# Patient Record
Sex: Male | Born: 1985 | Race: Black or African American | Hispanic: No | Marital: Single | Smoking: Never smoker
Health system: Southern US, Community
[De-identification: ages and names within clinical notes are randomized; demographics above are authoritative.]

## PROBLEM LIST (undated history)

## (undated) ENCOUNTER — Ambulatory Visit: Discharge status: Absent without leave | Payer: Medicaid Other

## (undated) DIAGNOSIS — D689 Coagulation defect, unspecified: Secondary | ICD-10-CM

## (undated) DIAGNOSIS — F32A Depression, unspecified: Secondary | ICD-10-CM

## (undated) DIAGNOSIS — F329 Major depressive disorder, single episode, unspecified: Secondary | ICD-10-CM

## (undated) DIAGNOSIS — F200 Paranoid schizophrenia: Secondary | ICD-10-CM

## (undated) DIAGNOSIS — F319 Bipolar disorder, unspecified: Secondary | ICD-10-CM

## (undated) DIAGNOSIS — F419 Anxiety disorder, unspecified: Secondary | ICD-10-CM

## (undated) HISTORY — PX: FRACTURE SURGERY: SHX138

## (undated) HISTORY — DX: Coagulation defect, unspecified: D68.9

---
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
## 2008-08-11 ENCOUNTER — Emergency Department (HOSPITAL_COMMUNITY): Admission: EM | Admit: 2008-08-11 | Discharge: 2008-08-11 | Payer: Self-pay | Admitting: Emergency Medicine

## 2008-09-06 ENCOUNTER — Emergency Department (HOSPITAL_COMMUNITY): Admission: EM | Admit: 2008-09-06 | Discharge: 2008-09-06 | Payer: Self-pay | Admitting: Emergency Medicine

## 2009-01-13 ENCOUNTER — Emergency Department (HOSPITAL_COMMUNITY): Admission: EM | Admit: 2009-01-13 | Discharge: 2009-01-13 | Payer: Self-pay | Admitting: Emergency Medicine

## 2014-07-26 ENCOUNTER — Emergency Department (HOSPITAL_COMMUNITY)
Admission: EM | Admit: 2014-07-26 | Discharge: 2014-07-26 | Disposition: A | Discharge status: Home / Self Care | Payer: Medicaid Other | Attending: Emergency Medicine | Admitting: Emergency Medicine

## 2014-07-26 ENCOUNTER — Encounter (HOSPITAL_COMMUNITY): Payer: Self-pay | Admitting: Emergency Medicine

## 2014-07-26 DIAGNOSIS — R369 Urethral discharge, unspecified: Secondary | ICD-10-CM

## 2014-07-26 DIAGNOSIS — Z202 Contact with and (suspected) exposure to infections with a predominantly sexual mode of transmission: Secondary | ICD-10-CM | POA: Diagnosis not present

## 2014-07-26 DIAGNOSIS — F17200 Nicotine dependence, unspecified, uncomplicated: Secondary | ICD-10-CM | POA: Insufficient documentation

## 2014-07-26 DIAGNOSIS — A64 Unspecified sexually transmitted disease: Secondary | ICD-10-CM | POA: Insufficient documentation

## 2014-07-26 LAB — URINALYSIS, ROUTINE W REFLEX MICROSCOPIC
BILIRUBIN URINE: NEGATIVE
Glucose, UA: NEGATIVE mg/dL
Ketones, ur: NEGATIVE mg/dL
NITRITE: NEGATIVE
PH: 6 (ref 5.0–8.0)
Protein, ur: NEGATIVE mg/dL
SPECIFIC GRAVITY, URINE: 1.029 (ref 1.005–1.030)
UROBILINOGEN UA: 0.2 mg/dL (ref 0.0–1.0)

## 2014-07-26 LAB — URINE MICROSCOPIC-ADD ON

## 2014-07-26 LAB — HIV ANTIBODY (ROUTINE TESTING W REFLEX): HIV: NONREACTIVE

## 2014-07-26 MED ORDER — DOXYCYCLINE HYCLATE 100 MG PO CAPS: 100.0000 mg | ORAL_CAPSULE | Freq: Two times a day (BID) | ORAL | Status: DC

## 2014-07-26 MED ORDER — CEFTRIAXONE SODIUM 250 MG IJ SOLR
250.0000 mg | Freq: Once | INTRAMUSCULAR | Status: AC
  Administered 2014-07-26: 250 mg via INTRAMUSCULAR
  Filled 2014-07-26: qty 250

## 2014-07-26 MED ORDER — AZITHROMYCIN 250 MG PO TABS
1000.0000 mg | ORAL_TABLET | Freq: Once | ORAL | Status: AC
  Administered 2014-07-26: 1000 mg via ORAL
  Filled 2014-07-26: qty 4

## 2014-07-26 MED ORDER — LIDOCAINE HCL (PF) 1 % IJ SOLN
5.0000 mL | Freq: Once | INTRAMUSCULAR | Status: AC
  Administered 2014-07-26: 5 mL
  Filled 2014-07-26: qty 5

## 2014-07-26 NOTE — ED Notes (Signed)
Pt reports having penile discharge and burning with urination x 1 day.

## 2014-07-26 NOTE — ED Notes (Signed)
Declined W/C at D/C and was escorted to lobby by RN. 

## 2014-07-26 NOTE — Discharge Instructions (Signed)
Please call your doctor for a followup appointment within 24-48 hours. When you talk to your doctor please let them know that you were seen in the emergency department and have them acquire all of your records so that they can discuss the findings with you and formulate a treatment plan to fully care for your new and ongoing problems. Please call and set-up an appointment with your primary care provider Please call and set-up an appointment with Health department Please take antibiotics as prescribed Please have partner(s) be tested as well Please refrain from sexual activity Please continue to monitor symptoms closely and if symptoms are to worsen or change (fever greater than 101, chills, sweating, nausea, vomiting, chest pain, shortness of breathe, difficulty breathing, weakness, numbness, tingling, worsening or changes to pain pattern, abdominal pain, penile swelling, sores, rashes, swelling to the testicles, blood in the urine) please report back to the Emergency Department immediately.    Safe Sex Safe sex is about reducing the risk of giving or getting a sexually transmitted disease (STD). STDs are spread through sexual contact involving the genitals, mouth, or rectum. Some STDs can be cured and others cannot. Safe sex can also prevent unintended pregnancies.  WHAT ARE SOME SAFE SEX PRACTICES?  Limit your sexual activity to only one partner who is having sex with only you.  Talk to your partner about his or her past partners, past STDs, and drug use.  Use a condom every time you have sexual intercourse. This includes vaginal, oral, and anal sexual activity. Both females and males should wear condoms during oral sex. Only use latex or polyurethane condoms and water-based lubricants. Using petroleum-based lubricants or oils to lubricate a condom will weaken the condom and increase the chance that it will break. The condom should be in place from the beginning to the end of sexual activity.  Wearing a condom reduces, but does not completely eliminate, your risk of getting or giving an STD. STDs can be spread by contact with infected body fluids and skin.  Get vaccinated for hepatitis B and HPV.  Avoid alcohol and recreational drugs, which can affect your judgment. You may forget to use a condom or participate in high-risk sex.  For females, avoid douching after sexual intercourse. Douching can spread an infection farther into the reproductive tract.  Check your body for signs of sores, blisters, rashes, or unusual discharge. See your health care provider if you notice any of these signs.  Avoid sexual contact if you have symptoms of an infection or are being treated for an STD. If you or your partner has herpes, avoid sexual contact when blisters are present. Use condoms at all other times.  If you are at risk of being infected with HIV, it is recommended that you take a prescription medicine daily to prevent HIV infection. This is called pre-exposure prophylaxis (PrEP). You are considered at risk if:  You are a man who has sex with other men (MSM).  You are a heterosexual man or woman who is sexually active with more than one partner.  You take drugs by injection.  You are sexually active with a partner who has HIV.  Talk with your health care provider about whether you are at high risk of being infected with HIV. If you choose to begin PrEP, you should first be tested for HIV. You should then be tested every 3 months for as long as you are taking PrEP.  See your health care provider for regular screenings, exams,  and tests for other STDs. Before having sex with a new partner, each of you should be screened for STDs and should talk about the results with each other. WHAT ARE THE BENEFITS OF SAFE SEX?   There is less chance of getting or giving an STD.  You can prevent unwanted or unintended pregnancies.  By discussing safe sex concerns with your partner, you may increase  feelings of intimacy, comfort, trust, and honesty between the two of you. Document Released: 11/30/2004 Document Revised: 03/09/2014 Document Reviewed: 04/15/2012 [facility] Patient Information 2015 [place], [place]. This information is not intended to replace advice given to you by your health care provider. Make sure you discuss any questions you have with your health care provider. Sexually Transmitted Disease A sexually transmitted disease (STD) is a disease or infection that may be passed (transmitted) from person to person, usually during sexual activity. This may happen by way of saliva, semen, blood, vaginal mucus, or urine. Common STDs include:   Gonorrhea.   Chlamydia.   Syphilis.   HIV and AIDS.   Genital herpes.   Hepatitis B and C.   Trichomonas.   Human papillomavirus (HPV).   Pubic lice.   Scabies.  Mites.  Bacterial vaginosis. WHAT ARE CAUSES OF STDs? An STD may be caused by bacteria, a virus, or parasites. STDs are often transmitted during sexual activity if one person is infected. However, they may also be transmitted through nonsexual means. STDs may be transmitted after:   Sexual intercourse with an infected person.   Sharing sex toys with an infected person.   Sharing needles with an infected person or using unclean piercing or tattoo needles.  Having intimate contact with the genitals, mouth, or rectal areas of an infected person.   Exposure to infected fluids during birth. WHAT ARE THE SIGNS AND SYMPTOMS OF STDs? Different STDs have different symptoms. Some people may not have any symptoms. If symptoms are present, they may include:   Painful or bloody urination.   Pain in the pelvis, abdomen, vagina, anus, throat, or eyes.   A skin rash, itching, or irritation.  Growths, ulcerations, blisters, or sores in the genital and anal areas.  Abnormal vaginal discharge with or without bad odor.   Penile discharge in men.   Fever.    Pain or bleeding during sexual intercourse.   Swollen glands in the groin area.   Yellow skin and eyes (jaundice). This is seen with hepatitis.   Swollen testicles.  Infertility.  Sores and blisters in the mouth. HOW ARE STDs DIAGNOSED? To make a diagnosis, your health care provider may:   Take a medical history.   Perform a physical exam.   Take a sample of any discharge to examine.  Swab the throat, cervix, opening to the penis, rectum, or vagina for testing.  Test a sample of your first morning urine.   Perform blood tests.   Perform a Pap test, if this applies.   Perform a colposcopy.   Perform a laparoscopy.  HOW ARE STDs TREATED? Treatment depends on the STD. Some STDs may be treated but not cured.   Chlamydia, gonorrhea, trichomonas, and syphilis can be cured with antibiotic medicine.   Genital herpes, hepatitis, and HIV can be treated, but not cured, with prescribed medicines. The medicines lessen symptoms.   Genital warts from HPV can be treated with medicine or by freezing, burning (electrocautery), or surgery. Warts may come back.   HPV cannot be cured with medicine or surgery. However, abnormal  areas may be removed from the cervix, vagina, or vulva.   If your diagnosis is confirmed, your recent sexual partners need treatment. This is true even if they are symptom-free or have a negative culture or evaluation. They should not have sex until their health care providers say it is okay. HOW CAN I REDUCE MY RISK OF GETTING AN STD? Take these steps to reduce your risk of getting an STD:  Use latex condoms, dental dams, and water-soluble lubricants during sexual activity. Do not use petroleum jelly or oils.  Avoid having multiple sex partners.  Do not have sex with someone who has other sex partners.  Do not have sex with anyone you do not know or who is at high risk for an STD.  Avoid risky sex practices that can break your skin.  Do  not have sex if you have open sores on your mouth or skin.  Avoid drinking too much alcohol or taking illegal drugs. Alcohol and drugs can affect your judgment and put you in a vulnerable position.  Avoid engaging in oral and anal sex acts.  Get vaccinated for HPV and hepatitis. If you have not received these vaccines in the past, talk to your health care provider about whether one or both might be right for you.   If you are at risk of being infected with HIV, it is recommended that you take a prescription medicine daily to prevent HIV infection. This is called pre-exposure prophylaxis (PrEP). You are considered at risk if:  You are a man who has sex with other men (MSM).  You are a heterosexual man or woman and are sexually active with more than one partner.  You take drugs by injection.  You are sexually active with a partner who has HIV.  Talk with your health care provider about whether you are at high risk of being infected with HIV. If you choose to begin PrEP, you should first be tested for HIV. You should then be tested every 3 months for as long as you are taking PrEP.  WHAT SHOULD I DO IF I THINK I HAVE AN STD?  See your health care provider.   Tell your sexual partner(s). They should be tested and treated for any STDs.  Do not have sex until your health care provider says it is okay. WHEN SHOULD I GET IMMEDIATE MEDICAL CARE? Contact your health care provider right away if:   You have severe abdominal pain.  You are a man and notice swelling or pain in your testicles.  You are a woman and notice swelling or pain in your vagina. Document Released: 01/13/2003 Document Revised: 10/28/2013 Document Reviewed: 05/13/2013 [facility] Patient Information 2015 [place], [place]. This information is not intended to replace advice given to you by your health care provider. Make sure you discuss any questions you have with your health care provider.   Emergency Department Resource  Guide 1) Find a Doctor and Pay Out of Pocket Although you won't have to find out who is covered by your insurance plan, it is a good idea to ask around and get recommendations. You will then need to call the office and see if the doctor you have chosen will accept you as a new patient and what types of options they offer for patients who are self-pay. Some doctors offer discounts or will set up payment plans for their patients who do not have insurance, but you will need to ask so you aren't surprised when you get  to your appointment.  2) Contact Your Local Health Department Not all health departments have doctors that can see patients for sick visits, but many do, so it is worth a call to see if yours does. If you don't know where your local health department is, you can check in your phone book. The CDC also has a tool to help you locate your state's health department, and many state websites also have listings of all of their local health departments.  3) Find a Walk-in Clinic If your illness is not likely to be very severe or complicated, you may want to try a walk in clinic. These are popping up all over the country in pharmacies, drugstores, and shopping centers. They're usually staffed by nurse practitioners or physician assistants that have been trained to treat common illnesses and complaints. They're usually fairly quick and inexpensive. However, if you have serious medical issues or chronic medical problems, these are probably not your best option.  No Primary Care Doctor: - Call Health Connect at  [phone] - they can help you locate a primary care doctor that  accepts your insurance, provides certain services, etc. - Physician Referral Service- [phone]  Chronic Pain Problems: Organization         Address  Phone   Notes  [facility]  [phone] Patients need to be referred by their primary care doctor.   Medication Assistance: Organization          Address  Phone   Notes  [facility] Medication [facility] [address] [place] [place], [place] [address] [phone] --Must be a resident of [facility] -- Must have NO insurance coverage whatsoever (no Medicaid/ Medicare, etc.) -- The pt. MUST have a primary care doctor that directs their care regularly and follows them in the community   [facility]  [phone]   [employer]  [phone]    Agencies that provide inexpensive medical care: Organization         Address  Phone   Notes  [facility]  [phone]   [facility]    [phone]   [facility] [address] [place], [place] [address] [phone]   [facility] [address] [place] [phone]   Planned Parenthood    [phone]   [facility]    [phone]   [facility]  [address], [place] Phone:  [phone], Fax:  [phone] Hours of Operation:  9 am - 6 pm, M-F.  Also accepts Medicaid/Medicare and self-pay.  [facility]  [address], [place] Phone: [phone], Fax: [phone]. Hours of Operation:  8:30 am - 5:30 pm, M-F.  Also accepts Medicaid and self-pay.  [facility] [place] [address] [place] Phone: [phone]   [facility] [address] Natasha Bence [place], [place] [phone], Ext. [phone] Mondays & Thursdays: 7-9 AM.  First 15 patients are seen on a first come, first serve basis.    Medicaid-accepting [facility] [place] Providers:  Organization         Address  Phone   Notes  [facility] [address], [place] [phone] Also accepts self-pay patients.  [facility] [address] Laurell Josephs [place], [place]  [phone]   [facility] [phone], [place] [phone]   [facility] [address], [place] [phone]   Renaye Rakers [address], [place]   [phone] Only accepts [place] Access [place] patients after they have their name applied to their card.   Self-Pay (no insurance) in [facility]:  Organization         Address  Phone   Notes  Sickle Cell Patients, [facility] Internal Medicine [address] [place], [place] [phone]   [facility] [address] [place], [place] [phone]   [facility] [place]  [address], [place] [phone]   [facility]/Dr. Osei-Bonsu  [address], [place] or [address], [place] [phone] Phone number for both [place] and [place] locations is the same.  [facility] [address] [place] [phone]   [facility] [address] [place] or [address] [phone]   [facility] [address] [place] [phone], phone; [phone], fax Sees patients 1st and 3rd Saturday of every month.  Must not qualify for public or private insurance (i.e. Medicaid, Medicare, [place] Health Choice, Veterans' Benefits)  Household income should be no more than 200% of the poverty level The clinic cannot treat you if you are pregnant or think you are pregnant  Sexually transmitted diseases are not treated at the clinic.    Dental Care: Organization         Address  Phone  Notes  [facility] [address] [place], [place] [phone] Accepts children up to age 40 who are enrolled in [place] or [place] Health Choice; pregnant women with a Medicaid card; and children who have applied for Medicaid or [place] Health Choice, but were declined, whose parents can pay a reduced fee at time of service.  [facility]  [address], [place] [phone] Accepts children up to age 58 who are enrolled in [place] or [place] Health Choice; pregnant women with a Medicaid card; and children who have applied for Medicaid or [place] Health Choice, but were declined, whose parents can pay a reduced fee at time of service.  [facility]  [address] [place], [place] [phone] Patients are seen by appointment only. Walk-ins are not accepted. [facility] will see patients 47 years of age and older. Monday - Tuesday (8am-5pm) Most Wednesdays (8:30-5pm) $30 per visit, cash only  [facility] Adult Dental Access PROGRAM  [address], [facility] [phone] Patients are seen by appointment only. Walk-ins are not accepted. [facility] will see patients 53 years of age and older. One Wednesday Evening (Monthly: Volunteer Based).  $30 per visit, cash only  [employer]  [phone] for adults; Children under age 43, call Graduate Pediatric Dentistry at [phone]. Children aged 22-14, please call [phone] to request a pediatric application.  Dental services are provided in all areas of dental care including fillings, crowns and bridges, complete and partial dentures, implants, gum treatment, root canals, and extractions. Preventive care is also provided. Treatment is provided to both adults and children. Patients are selected via a lottery and there is often a waiting list.   [facility] [phone]  [address], [place]in[place] Otto  [phone] [email or web address]   [facility] [address] [place], [place] [phone], Ext. [phone] Second and Fourth Thursday of each month, opens at 6:30 AM; Clinic ends at 9 AM.  Patients are seen on a first-come first-served basis, and a limited number are seen during each clinic.   [facility]  [address] Ether Griffins [place], [place] [phone]   Eligibility Requirements You must  have lived in [place], [place], or [place] counties for at least the last three months.   You cannot be eligible for state or federal sponsored [place], including [employer], [place], or [employer].   You generally cannot be eligible for healthcare insurance through your employer.    How to apply: Eligibility screenings are held every Tuesday and Wednesday afternoon from 1:00 pm until 4:00 pm. You do not need an appointment for the interview!  [facility] [address] [place], [place] [phone]   [facility] Health Department  [phone]   [facility] Health Department  [phone]   [facility] Health Department  [phone]    Behavioral Health Resources in the Community: Intensive Outpatient Programs Organization         Address  Phone  Notes  [facility] [address], [place], [place] [phone]   [facility] Outpatient [address], [place], [place] [phone]   [facility] [address] [place], [place]  [phone]   [facility] [address]  [place], [place] [phone] or [phone]   Substance Abuse Resources Organization         Address  Phone  Notes  [facility]  [phone]   [facility]  [phone]   [facility]  [phone]   Floydene Flock  [phone]   Residential & Outpatient Substance Abuse Program  [phone]   Psychological Services Organization         Address  Phone  Notes  [facility] Behavioral Health  [phone]   [facility] Services  [phone]   [facility] Mental Health [address], [place] [phone] or [phone]    Mobile Crisis Teams Organization         Address  Phone  Notes  [facility], Mobile Crisis Care Unit  [phone]   [facility]  [address] [place] [address] [place], [place] [phone]   Doristine Locks [address] [place] [phone]    Self-Help/Support Groups Organization         Address  Phone             Notes  [facility] [place] - variety of support groups  336- I7437963 Call for more information  Narcotics Anonymous (NA), [facility] [address], [employer] [place]  2 meetings at this location   Statistician         Address  Phone  Notes  [facility] [address],    [place]  [phone]   [facility]  [address] [place] [address], [place], [place] [phone]   [facility] [address] [place], [place] [phone] Admissions: 8am-3pm M-F  [facility] [address],    [place], [place] [phone]   [facility] [address] [place], [place] [phone]   [facility] [address],  [place], [place]  [phone]   [facility] - Intensive Outpatient [address] Laurell Josephs [address], [place], [place] [phone]   [facility] ([facility] [address]  [place], [place] [phone] or [phone]   [facility] ([facility]) [address], [place], [place] [phone] Accepts Medicaid  [facility] [address]  [place] [phone] Substance Abuse/Addiction Treatment   [facility] Organization         Address  Phone  Notes  [facility]  [phone]   Angie Fava, PhD [address] Ervin Knack [place], [place]   [phone] or [phone]   [facility]   [address] [place], [place] [phone]   [facility] [address] [place], [place] [phone] Insurance/Medicaid/sponsorship through [facility] Families [address]                                    [place], [place] [phone] Therapy/tele-psych/case  [facility] [address], [place] [phone]    Dr. Lolly Mustache  [phone]   [facility]  [employer] [facility] Dept. 1) [address], [place] 2) [address] [place] 3)  [address], [place] [phone]  [phone]   [facility] Child Abuse Hotline (226) [phone] or [phone] (After Hours)

## 2014-07-26 NOTE — ED Provider Notes (Signed)
CSN: [id number]     Arrival date & time 07/26/14  0818 History   First MD Initiated Contact with Patient 07/26/14 [phone]     Chief Complaint  Patient presents with  . SEXUALLY TRANSMITTED DISEASE     (Consider location/radiation/quality/duration/timing/severity/associated sxs/prior Treatment) The history is provided by the patient. No language interpreter was used.  Jacob Rogers is a 28 y/o M with no known significant PMHx presenting to the ED with penile discharge and dysuria that has been ongoing since Friday night. Patient reported that the discharge is a clear, whitish color with no odor known. Patient reported that he has been having burning with urination. Stated that he is sexually active and normally uses protection, but stated that one encounter one week ago he did not. Denied itching, swelling to the penis, sores, lesions, hematuria, groin pain, abdominal pain, nausea, vomiting, diarrhea, fever, chills, rash, neck pain, neck stiffness, sore throat. PCP none  History reviewed. No pertinent past medical history. History reviewed. No pertinent past surgical history. History reviewed. No pertinent family history. History  Substance Use Topics  . Smoking status: Current Every Day Smoker    Types: Cigarettes  . Smokeless tobacco: Not on file  . Alcohol Use: No    Review of Systems  Constitutional: Negative for fever and chills.  Gastrointestinal: Negative for nausea, vomiting, abdominal pain and diarrhea.  Genitourinary: Positive for dysuria and discharge. Negative for frequency, hematuria, flank pain, penile swelling, scrotal swelling, genital sores, penile pain and testicular pain.  Musculoskeletal: Negative for back pain, neck pain and neck stiffness.      Allergies  Review of patient's allergies indicates no known allergies.  Home Medications   Prior to Admission medications   Medication Sig Start Date End Date Taking? Authorizing Provider  doxycycline (VIBRAMYCIN)  100 MG capsule Take 1 capsule (100 mg total) by mouth 2 (two) times daily. One po bid x 7 days 07/26/14   Shaivi Rothschild, PA-C   BP 133/79  Pulse 86  Temp(Src) 98.1 F (36.7 C) (Oral)  Resp 20  Ht  (1.803 m)  Wt 188 lb (85.276 kg)  BMI 26.23 kg/m2  SpO2 98% Physical Exam  Nursing note and vitals reviewed. Constitutional: He is oriented to person, place, and time. He appears well-developed and well-nourished. No distress.  HENT:  Head: Normocephalic and atraumatic.  Eyes: Conjunctivae and EOM are normal. Pupils are equal, round, and reactive to light. Right eye exhibits no discharge. Left eye exhibits no discharge.  Neck: Normal range of motion. Neck supple. No tracheal deviation present.  Negative neck stiffness Negative nuchal rigidity Negative cervical lymphadenopathy Negative meningeal signs  Cardiovascular: Normal rate, regular rhythm and normal heart sounds.  Exam reveals no friction rub.   No murmur heard. Pulses:      Radial pulses are 2+ on the right side, and 2+ on the left side.  Pulmonary/Chest: Effort normal and breath sounds normal. No respiratory distress. He has no wheezes. He has no rales.  Abdominal: Soft. Bowel sounds are normal. He exhibits no distension. There is no tenderness. There is no rebound and no guarding.  Negative abdominal distention Bowel sounds normal active in all 4 quadrants Abdomen soft upon palpation Negative pain upon palpation Negative peritoneal signs, guarding, rigidity  Genitourinary:  Pelvic exam: Negative swelling, erythema, formation, lesions, sores identified to the penis, scrotal region. Negative inguinal lymphadenopathy noted. Negative discharge noted from the tip of the penis. Unremarkable glans penis. Exam chaperoned with nurse.  Musculoskeletal: Normal  range of motion.  Lymphadenopathy:    He has no cervical adenopathy.  Neurological: He is alert and oriented to person, place, and time. No cranial nerve deficit. He  exhibits normal muscle tone. Coordination normal.  Skin: Skin is warm and dry. No rash noted. He is not diaphoretic. No erythema.  Psychiatric: He has a normal mood and affect. His behavior is normal. Thought content normal.    ED Course  Procedures (including critical care time)  Results for orders placed during the hospital encounter of 07/26/14  URINALYSIS, ROUTINE W REFLEX MICROSCOPIC      Result Value Ref Range   Color, Urine YELLOW  YELLOW   APPearance CLOUDY (*) CLEAR   Specific Gravity, Urine 1.029  1.005 - 1.030   pH 6.0  5.0 - 8.0   Glucose, UA NEGATIVE  NEGATIVE mg/dL   Hgb urine dipstick TRACE (*) NEGATIVE   Bilirubin Urine NEGATIVE  NEGATIVE   Ketones, ur NEGATIVE  NEGATIVE mg/dL   Protein, ur NEGATIVE  NEGATIVE mg/dL   Urobilinogen, UA 0.2  0.0 - 1.0 mg/dL   Nitrite NEGATIVE  NEGATIVE   Leukocytes, UA MODERATE (*) NEGATIVE  URINE MICROSCOPIC-ADD ON      Result Value Ref Range   WBC, UA TOO NUMEROUS TO COUNT  <3 WBC/hpf   RBC / HPF 3-6  <3 RBC/hpf   Bacteria, UA FEW (*) RARE    Labs Review Labs Reviewed  URINALYSIS, ROUTINE W REFLEX MICROSCOPIC - Abnormal; Notable for the following:    APPearance CLOUDY (*)    Hgb urine dipstick TRACE (*)    Leukocytes, UA MODERATE (*)    All other components within normal limits  URINE MICROSCOPIC-ADD ON - Abnormal; Notable for the following:    Bacteria, UA FEW (*)    All other components within normal limits  GC/CHLAMYDIA PROBE AMP  HIV ANTIBODY (ROUTINE TESTING)    Imaging Review No results found.   EKG Interpretation None      MDM   Final diagnoses:  Penile discharge  STD exposure    Medications  azithromycin (ZITHROMAX) tablet 1,000 mg (1,000 mg Oral Given 07/26/14 0948)  cefTRIAXone (ROCEPHIN) injection 250 mg (250 mg Intramuscular Given 07/26/14 0948)  lidocaine (PF) (XYLOCAINE) 1 % injection 5 mL (5 mLs Other Given 07/26/14 0948)    Filed Vitals:   07/26/14 0823  BP: 133/79  Pulse: 86  Temp: 98.1  F (36.7 C)  TempSrc: Oral  Resp: 20  Height:  (1.803 m)  Weight: 188 lb (85.276 kg)  SpO2: 98%   Urinalysis noted large amount of leukocytes with white blood cells too numerous to count. HIV, GC Chlamydia probe pending. Patient presenting with symptoms of STD - patient treated prophylactically in the ED setting. Patient stable, afebrile patient not septic appearing. Discharged patient. Discharged patient with antibiotics. Discussed with patient to rest and stay hydrated. Referred to health department. Discussed with patient importance of safe sex habits. Discussed with patient top refrain from sexual activity. Recommended partner(s) to be tested as well. Discussed with patient to closely monitor symptoms and if symptoms are to worsen or change to report back to the ED - strict return instructions given.  Patient agreed to plan of care, understood, all questions answered.   Raymon Mutton, PA-C 07/26/14 1022

## 2014-07-28 LAB — GC/CHLAMYDIA PROBE AMP
CT PROBE, AMP APTIMA: POSITIVE — AB
GC PROBE AMP APTIMA: POSITIVE — AB

## 2014-07-28 NOTE — ED Provider Notes (Signed)
Medical screening examination/treatment/procedure(s) were performed by non-physician practitioner and as supervising physician I was immediately available for consultation/collaboration.   Maico Mulvehill L Paislea Hatton, MD 07/28/14 2147 

## 2014-07-29 ENCOUNTER — Telehealth (HOSPITAL_COMMUNITY): Payer: Self-pay

## 2014-07-29 NOTE — ED Notes (Signed)
erified id. informed of positive test results for gonorrhea and chlamydia. treated per protocol. informed to notify partner and abstain from sexual activity x 10 Days. [employer] form completed and faxed.

## 2016-06-24 ENCOUNTER — Emergency Department (HOSPITAL_COMMUNITY)

## 2016-06-24 ENCOUNTER — Emergency Department (HOSPITAL_COMMUNITY)
Admission: EM | Admit: 2016-06-24 | Discharge: 2016-06-25 | Disposition: A | Discharge status: Home / Self Care | Attending: Emergency Medicine | Admitting: Emergency Medicine

## 2016-06-24 ENCOUNTER — Encounter (HOSPITAL_COMMUNITY): Payer: Self-pay | Admitting: *Deleted

## 2016-06-24 DIAGNOSIS — M25562 Pain in left knee: Secondary | ICD-10-CM | POA: Diagnosis not present

## 2016-06-24 DIAGNOSIS — W1839XA Other fall on same level, initial encounter: Secondary | ICD-10-CM | POA: Diagnosis not present

## 2016-06-24 DIAGNOSIS — Z87891 Personal history of nicotine dependence: Secondary | ICD-10-CM | POA: Diagnosis not present

## 2016-06-24 DIAGNOSIS — Y9367 Activity, basketball: Secondary | ICD-10-CM | POA: Insufficient documentation

## 2016-06-24 DIAGNOSIS — Y929 Unspecified place or not applicable: Secondary | ICD-10-CM | POA: Diagnosis not present

## 2016-06-24 DIAGNOSIS — Y999 Unspecified external cause status: Secondary | ICD-10-CM | POA: Diagnosis not present

## 2016-06-24 HISTORY — DX: Major depressive disorder, single episode, unspecified: F32.9

## 2016-06-24 HISTORY — DX: Paranoid schizophrenia: F20.0

## 2016-06-24 HISTORY — DX: Depression, unspecified: F32.A

## 2016-06-24 MED ORDER — HYDROCODONE-ACETAMINOPHEN 5-325 MG PO TABS: ORAL_TABLET | ORAL | 0 refills | Status: DC

## 2016-06-24 MED ORDER — IBUPROFEN 800 MG PO TABS
800.0000 mg | ORAL_TABLET | Freq: Once | ORAL | Status: AC
  Administered 2016-06-24: 800 mg via ORAL
  Filled 2016-06-24: qty 1

## 2016-06-24 MED ORDER — IBUPROFEN 800 MG PO TABS: 800.0000 mg | ORAL_TABLET | Freq: Three times a day (TID) | ORAL | 0 refills | Status: DC

## 2016-06-24 MED ORDER — OXYCODONE-ACETAMINOPHEN 5-325 MG PO TABS
1.0000 | ORAL_TABLET | Freq: Once | ORAL | Status: AC
  Administered 2016-06-24: 1 via ORAL
  Filled 2016-06-24: qty 1

## 2016-06-24 NOTE — ED Provider Notes (Signed)
[facility]-EMERGENCY DEPT Provider Note   CSN: [id number] Arrival date & time: 06/24/16  2019     History   Chief Complaint Chief Complaint  Patient presents with  . Knee Pain    HPI Jacob Rogers is a 30 y.o. male.  HPI   Jacob Rogers is a 30 y.o. male who is an inmate at a local correctional facility, who presents to the Emergency Department complaining of sudden onset of pain to the left knee approximately 2 hours prior to arrival.  He states that he was playing basketball when he stepped on someone's foot which caused him to "roll" his knee and fell.  He states he has been unable to bear weight since the accident.  He states he had a previous injury to the knee some time ago, that was never evaluated.  He denies numbness of the extremity, hip or back pain and pain to the calf.    Past Medical History:  Diagnosis Date  . Depression   . Paranoid schizophrenia (HCC)     There are no active problems to display for this patient.   Past Surgical History:  Procedure Laterality Date  . FRACTURE SURGERY     right foot       Home Medications    Prior to Admission medications   Medication Sig Start Date End Date Taking? Authorizing Provider  doxycycline (VIBRAMYCIN) 100 MG capsule Take 1 capsule (100 mg total) by mouth 2 (two) times daily. One po bid x 7 days 07/26/14   Raymon MuttonMarissa Sciacca, PA-C    Family History History reviewed. No pertinent family history.  Social History Social History  Substance Use Topics  . Smoking status: Former Smoker    Types: Cigarettes  . Smokeless tobacco: Never Used  . Alcohol use No     Allergies   Review of patient's allergies indicates no known allergies.   Review of Systems Review of Systems  Constitutional: Negative for chills and fever.  Genitourinary: Negative for difficulty urinating and dysuria.  Musculoskeletal: Positive for arthralgias (left knee pain). Negative for joint swelling.  Skin: Negative for color  change and wound.  Neurological: Negative for weakness and numbness.  All other systems reviewed and are negative.    Physical Exam Updated Vital Signs BP 125/64 (BP Location: Left Arm)   Pulse 77   Temp 98.8 F (37.1 C) (Oral)   Resp 18   Ht 5\' 11"  (1.803 m)   Wt 86.2 kg   SpO2 99%   BMI 26.50 kg/m   Physical Exam  Constitutional: He is oriented to person, place, and time. He appears well-developed and well-nourished. No distress.  HENT:  Head: Atraumatic.  Cardiovascular: Normal rate, regular rhythm and intact distal pulses.   Pulmonary/Chest: Effort normal and breath sounds normal.  Musculoskeletal: He exhibits tenderness. He exhibits no deformity.  ttp of the medial left knee.  No erythema, effusion, or step-off deformity.  Patella tendon appears intact. DP and PT pulses brisk, distal sensation intact. Calf is soft and NT.  Neurological: He is alert and oriented to person, place, and time. He exhibits normal muscle tone. Coordination normal.  Skin: Skin is warm and dry. No erythema.  Nursing note and vitals reviewed.    ED Treatments / Results  Labs (all labs ordered are listed, but only abnormal results are displayed) Labs Reviewed - No data to display  EKG  EKG Interpretation None       Radiology Ct Knee Left Wo Contrast  Result  Date: 06/24/2016 CLINICAL DATA:  Status post basketball injury, with apparent tiny posterior tibial plateau fracture fragment. Initial encounter. EXAM: CT OF THE RIGHT KNEE WITHOUT CONTRAST TECHNIQUE: Multidetector CT imaging of the right knee was performed according to the standard protocol. Multiplanar CT image reconstructions were also generated. COMPARISON:  None. FINDINGS: Bones/Joint/Cartilage Several small osseous fragments are seen along the posterior edge of the tibial spine and medial tibial plateau. The largest fragment measures 8 mm in size and may reflect injury about the posterior root of the medial meniscus. Remaining  fragments may reflect mild focal impaction injury. Ligaments Suboptimally assessed by CT. The anterior cruciate ligament appears grossly intact. The posterior cruciate ligament is difficult to fully assess. Mild soft tissue swelling is suggested about the medial collateral ligament and lateral collateral ligament complex. Muscles and Tendons The visualized musculature is grossly unremarkable in appearance. The quadriceps and patellar tendons appear grossly intact, aside from minimal edema tracking about the patellar tendon. Soft tissues Minimal edema is seen at Hoffa's fat pad. A small to moderate knee joint effusion is noted. The menisci are not well assessed on CT. IMPRESSION: 1. Several small osseous fragment seen about the posterior edge of the tibial spine and medial tibial plateau. The largest fragment measures 8 mm and may reflect injury about the posterior root of the medial meniscus. Remaining fragment may reflect mild focal impaction injury. 2. Posterior cruciate ligament not well assessed. Mild soft tissue swelling suggested about the medial collateral ligament and lateral collateral ligament complex. Minimal edema tracking about the patellar tendon, and at Hoffa's fat pad. MRI of the left knee would be helpful for further evaluation, to assess for internal derangement. 3. Small to moderate knee joint effusion noted. Electronically Signed   By: Roanna Raider M.D.   On: 06/24/2016 22:55   Dg Knee Complete 4 Views Left  Result Date: 06/24/2016 CLINICAL DATA:  30 year old male status post basketball injury tonight with pain. Initial encounter. EXAM: LEFT KNEE - COMPLETE 4+ VIEW COMPARISON:  None. FINDINGS: Small suprapatellar joint effusion is evident. Preserved joint spaces and alignment. Bone mineralization is within normal limits. Patella intact. There is a small 4-5 mm fracture fragment visible long the posterior tibial plateau on the lateral view. Otherwise no acute osseous abnormality. IMPRESSION:  Evidence of a small joint effusion and tiny fracture fragment visible long the posterior tibial plateau, raising the possibility of internal derangement with a small impaction fracture. Electronically Signed   By: Odessa Fleming M.D.   On: 06/24/2016 20:45    Procedures Procedures (including critical care time)  Medications Ordered in ED Medications  oxyCODONE-acetaminophen (PERCOCET/ROXICET) 5-325 MG per tablet 1 tablet (1 tablet Oral Given 06/24/16 2117)  ibuprofen (ADVIL,MOTRIN) tablet 800 mg (800 mg Oral Given 06/24/16 2117)     Initial Impression / Assessment and Plan / ED Course  I have reviewed the triage vital signs and the nursing notes.  Pertinent labs & imaging results that were available during my care of the patient were reviewed by me and considered in my medical decision making (see chart for details).  Clinical Course    2125  Consulted Dr. Linna Caprice who reviewed the images and recommended CT of the knee since MRI unavailable tonight, knee immobilizer, crutches and close orthopedic follow-up. Pt is inmate and their speciality services are contracted with another facility, so I have consulted Loraine Leriche, NP, at the correctional facilty and I was advised that services are contracted through [facility] orthopedics and he agrees to arrange the  follow-up.     I have discussed with the patient the likelihood of internal derangement of the knee and importance of follow-up, he agrees to plan and verbalized understanding.   He remains NV intact.  Has crutches from the facility, knee immobilizer applied here.  Advised to ice and no weight bearing.    Final Clinical Impressions(s) / ED Diagnoses   Final diagnoses:  Knee pain, acute, left    New Prescriptions New Prescriptions   No medications on file     Pauline Ausammy Lucifer Soja, Cordelia Poche-C 06/24/16 2344    Margarita Grizzleanielle Ray, MD 06/29/16 1252

## 2016-06-24 NOTE — ED Triage Notes (Signed)
Pt was playing basketball about an hour ago and he stepped on another persons foot and he injured his left knee. Pt states instead of "rolling his ankle," he says he "rolled his left knee."

## 2016-06-24 NOTE — Discharge Instructions (Signed)
Keep the knee immobilizer in place and use the crutches for weight bearing.  Apply ice packs on/off to your knee.  He will need to follow-up with orthopedics next week and this has been discussed with, Loraine LericheMark the NP, at the facility.

## 2016-06-25 NOTE — ED Notes (Signed)
[facility] phone number [phone]

## 2018-10-31 ENCOUNTER — Emergency Department (HOSPITAL_COMMUNITY): Admission: EM | Admit: 2018-10-31 | Discharge: 2018-10-31 | Discharge status: Against Medical Advice

## 2018-11-01 ENCOUNTER — Encounter (HOSPITAL_COMMUNITY): Payer: Self-pay | Admitting: Emergency Medicine

## 2018-11-01 ENCOUNTER — Emergency Department (HOSPITAL_COMMUNITY): Payer: Self-pay

## 2018-11-01 ENCOUNTER — Emergency Department (HOSPITAL_COMMUNITY)
Admission: EM | Admit: 2018-11-01 | Discharge: 2018-11-01 | Disposition: A | Discharge status: Home / Self Care | Payer: Self-pay | Attending: Emergency Medicine | Admitting: Emergency Medicine

## 2018-11-01 DIAGNOSIS — R0982 Postnasal drip: Secondary | ICD-10-CM | POA: Insufficient documentation

## 2018-11-01 DIAGNOSIS — F200 Paranoid schizophrenia: Secondary | ICD-10-CM | POA: Insufficient documentation

## 2018-11-01 DIAGNOSIS — R590 Localized enlarged lymph nodes: Secondary | ICD-10-CM | POA: Insufficient documentation

## 2018-11-01 DIAGNOSIS — Z87891 Personal history of nicotine dependence: Secondary | ICD-10-CM | POA: Insufficient documentation

## 2018-11-01 DIAGNOSIS — R059 Cough, unspecified: Secondary | ICD-10-CM

## 2018-11-01 DIAGNOSIS — Z202 Contact with and (suspected) exposure to infections with a predominantly sexual mode of transmission: Secondary | ICD-10-CM | POA: Insufficient documentation

## 2018-11-01 DIAGNOSIS — R05 Cough: Secondary | ICD-10-CM | POA: Insufficient documentation

## 2018-11-01 MED ORDER — CETIRIZINE-PSEUDOEPHEDRINE ER 5-120 MG PO TB12: 1.0000 | ORAL_TABLET | Freq: Every day | ORAL | 0 refills | Status: DC

## 2018-11-01 MED ORDER — LIDOCAINE HCL (PF) 1 % IJ SOLN
INTRAMUSCULAR | Status: AC
  Administered 2018-11-01: 3 mL
  Filled 2018-11-01: qty 5

## 2018-11-01 MED ORDER — METRONIDAZOLE 500 MG PO TABS
2000.0000 mg | ORAL_TABLET | Freq: Once | ORAL | Status: AC
  Administered 2018-11-01: 2000 mg via ORAL
  Filled 2018-11-01: qty 4

## 2018-11-01 MED ORDER — AZITHROMYCIN 250 MG PO TABS
1000.0000 mg | ORAL_TABLET | Freq: Once | ORAL | Status: AC
  Administered 2018-11-01: 1000 mg via ORAL
  Filled 2018-11-01: qty 4

## 2018-11-01 MED ORDER — CEFTRIAXONE SODIUM 250 MG IJ SOLR
250.0000 mg | Freq: Once | INTRAMUSCULAR | Status: AC
  Administered 2018-11-01: 250 mg via INTRAMUSCULAR
  Filled 2018-11-01: qty 250

## 2018-11-01 NOTE — Discharge Instructions (Signed)
You have been treated for gonorrhea and chlamydia today. You will be called in 3 days if any of your tests return positive. In that case, please make all of your sexual partners aware that they will need to be treated as well. Abstain from intercourse for one week until you have both been treated. Use condoms in the future to help prevent sexually transmitted disease and unwanted pregnancy. You can go to the health department in the future for free STD testing.  Take Zyrtec-D to help with your cough and nasal congestion.  I also recommend using an over-the-counter nasal saline spray. Please return the emergency department you develop any new or worsening symptoms.

## 2018-11-01 NOTE — ED Triage Notes (Signed)
Pt. Stated, Ie had a cough for a month and I also want to be checked for  STD. Ive had unprotected sex.No symptoms.

## 2018-11-01 NOTE — ED Triage Notes (Signed)
Pt. Stated, I have a lump underneath chin

## 2018-11-01 NOTE — ED Provider Notes (Signed)
[facility] Provider [facility]   CSN: [id number] Arrival date & time: 11/01/18  1003     History   Chief Complaint Chief Complaint  Patient presents with  . Cough  . Exposure to STD    HPI Jacob Rogers is a 32 y.o. male with history of depression, paranoid schizophrenia who presents with a one-month history of cough and also for exposure to STD.  He reports his girlfriend was positive for a "bacterial infection" and that he needed to be treated.  He denies any urinary symptoms, penile discharge, scrotal pain or swelling, abdominal pain, nausea, vomiting, fevers.  Patient reports his cough started 1 month ago with sore throat and congestion, however his cough has persisted intermittently.  He also has a swollen gland on his left neck.  He has not been taking any medications for this.  He does not smoke.  HPI  Past Medical History:  Diagnosis Date  . Depression   . Paranoid schizophrenia (HCC)     There are no active problems to display for this patient.   Past Surgical History:  Procedure Laterality Date  . FRACTURE SURGERY     right foot        Home Medications    Prior to Admission medications   Medication Sig Start Date End Date Taking? Authorizing Provider  cetirizine-pseudoephedrine (ZYRTEC-D) 5-120 MG tablet Take 1 tablet by mouth daily. 11/01/18   Emi HolesLaw, Lavora Brisbon M, PA-C  HYDROcodone-acetaminophen (NORCO/VICODIN) 5-325 MG tablet Take one-two tabs po q 4-6 hrs prn pain 06/24/16   Triplett, Tammy, PA-C  ibuprofen (ADVIL,MOTRIN) 800 MG tablet Take 1 tablet (800 mg total) by mouth 3 (three) times daily. 06/24/16   Triplett, Babette Relicammy, PA-C    Family History No family history on file.  Social History Social History   Tobacco Use  . Smoking status: Former Smoker    Types: Cigarettes  . Smokeless tobacco: Never Used  Substance Use Topics  . Alcohol use: No  . Drug use: No     Allergies   Patient has no known  allergies.   Review of Systems Review of Systems  Constitutional: Negative for fever.  HENT: Negative for sore throat.   Respiratory: Positive for cough.   Genitourinary: Negative for discharge, dysuria, penile pain, penile swelling, scrotal swelling and testicular pain.     Physical Exam Updated Vital Signs BP 134/86 (BP Location: Right Arm)   Pulse 87   Temp 98.9 F (37.2 C) (Oral)   Resp 20   SpO2 96%   Physical Exam Vitals signs and nursing note reviewed. Exam conducted with a chaperone present.  Constitutional:      General: He is not in acute distress.    Appearance: He is well-developed. He is not diaphoretic.  HENT:     Head: Normocephalic and atraumatic.     Mouth/Throat:     Pharynx: No oropharyngeal exudate.  Eyes:     General: No scleral icterus.       Right eye: No discharge.        Left eye: No discharge.     Conjunctiva/sclera: Conjunctivae normal.     Pupils: Pupils are equal, round, and reactive to light.  Neck:     Musculoskeletal: Normal range of motion and neck supple.     Thyroid: No thyromegaly.   Cardiovascular:     Rate and Rhythm: Normal rate and regular rhythm.     Heart sounds: Normal heart sounds. No murmur. No friction  rub. No gallop.   Pulmonary:     Effort: Pulmonary effort is normal. No respiratory distress.     Breath sounds: Normal breath sounds. No stridor. No wheezing or rales.  Abdominal:     General: Bowel sounds are normal. There is no distension.     Palpations: Abdomen is soft.     Tenderness: There is no abdominal tenderness. There is no guarding or rebound.  Genitourinary:    Penis: Normal and circumcised. No discharge.      Scrotum/Testes: Normal.     Epididymis:     Right: Normal.     Left: Normal.  Lymphadenopathy:     Cervical: Cervical adenopathy present.  Skin:    General: Skin is warm and dry.     Coloration: Skin is not pale.     Findings: No rash.  Neurological:     Mental Status: He is alert.      Coordination: Coordination normal.      ED Treatments / Results  Labs (all labs ordered are listed, but only abnormal results are displayed) Labs Reviewed  RPR  HIV ANTIBODY (ROUTINE TESTING W REFLEX)  GC/CHLAMYDIA PROBE AMP ([facility]    EKG None  Radiology Dg Chest 2 View  Result Date: 11/01/2018 CLINICAL DATA:  Productive cough and sore throat for 1 month. EXAM: CHEST - 2 VIEW COMPARISON:  None. FINDINGS: The lungs are clear. Heart size is normal. No pneumothorax or pleural effusion. No bony abnormality. IMPRESSION: Negative chest. Electronically Signed   By: Drusilla Kannerhomas  Dalessio M.D.   On: 11/01/2018 10:59    Procedures Procedures (including critical care time)  Medications Ordered in ED Medications  cefTRIAXone (ROCEPHIN) injection 250 mg (250 mg Intramuscular Given 11/01/18 1122)  azithromycin (ZITHROMAX) tablet 1,000 mg (1,000 mg Oral Given 11/01/18 1122)  metroNIDAZOLE (FLAGYL) tablet 2,000 mg (2,000 mg Oral Given 11/01/18 1123)  lidocaine (PF) (XYLOCAINE) 1 % injection (3 mLs  Given 11/01/18 1123)     Initial Impression / Assessment and Plan / ED Course  I have reviewed the triage vital signs and the nursing notes.  Pertinent labs & imaging results that were available during my care of the patient were reviewed by me and considered in my medical decision making (see chart for details).     Patient presenting with 1 month history of cough and also a concern for STD exposure after his girlfriend was diagnosed with an STD.  Chest x-ray is negative.  Suspect postnasal drainage.  Will treat with Zyrtec-D and nasal saline.  Patient given Rocephin, azithromycin, and Flagyl cover STDs.  HIV and RPR, GC/chlamydia pending.  Patient vies to follow-up at health department if positive for HIV or syphilis.  Return precautions discussed.  Patient understands and agrees with plan.  Patient vitals stable throughout ED course and was discharged in satisfactory  condition.  Final Clinical Impressions(s) / ED Diagnoses   Final diagnoses:  Cough  STD exposure  PND (post-nasal drip)    ED Discharge Orders         Ordered    cetirizine-pseudoephedrine (ZYRTEC-D) 5-120 MG tablet  Daily     11/01/18 [address]           Keegan Ducey M, [place]PA-C 11/01/18 1230    Mesner, Barbara CowerJason, MD 11/01/18 1523

## 2018-11-02 ENCOUNTER — Encounter (HOSPITAL_COMMUNITY): Payer: Self-pay

## 2018-11-02 ENCOUNTER — Other Ambulatory Visit: Payer: Self-pay

## 2018-11-02 ENCOUNTER — Emergency Department (HOSPITAL_COMMUNITY): Payer: Self-pay

## 2018-11-02 ENCOUNTER — Inpatient Hospital Stay (HOSPITAL_COMMUNITY)
Admission: EM | Admit: 2018-11-02 | Discharge: 2018-11-08 | DRG: 481 | Disposition: A | Discharge status: Home / Self Care | Payer: Self-pay | Attending: Orthopaedic Surgery | Admitting: Orthopaedic Surgery

## 2018-11-02 DIAGNOSIS — W3400XA Accidental discharge from unspecified firearms or gun, initial encounter: Secondary | ICD-10-CM

## 2018-11-02 DIAGNOSIS — Z419 Encounter for procedure for purposes other than remedying health state, unspecified: Secondary | ICD-10-CM

## 2018-11-02 DIAGNOSIS — R202 Paresthesia of skin: Secondary | ICD-10-CM | POA: Diagnosis present

## 2018-11-02 DIAGNOSIS — R000 Tachycardia, unspecified: Secondary | ICD-10-CM | POA: Diagnosis present

## 2018-11-02 DIAGNOSIS — I959 Hypotension, unspecified: Secondary | ICD-10-CM | POA: Diagnosis present

## 2018-11-02 DIAGNOSIS — S7222XB Displaced subtrochanteric fracture of left femur, initial encounter for open fracture type I or II: Principal | ICD-10-CM | POA: Diagnosis present

## 2018-11-02 DIAGNOSIS — Y92008 Other place in unspecified non-institutional (private) residence as the place of occurrence of the external cause: Secondary | ICD-10-CM

## 2018-11-02 DIAGNOSIS — D62 Acute posthemorrhagic anemia: Secondary | ICD-10-CM | POA: Diagnosis not present

## 2018-11-02 DIAGNOSIS — S71132A Puncture wound without foreign body, left thigh, initial encounter: Secondary | ICD-10-CM

## 2018-11-02 HISTORY — DX: Accidental discharge from unspecified firearms or gun, initial encounter: W34.00XA

## 2018-11-02 LAB — HIV ANTIBODY (ROUTINE TESTING W REFLEX): HIV Screen 4th Generation wRfx: NONREACTIVE

## 2018-11-02 LAB — I-STAT CG4 LACTIC ACID, ED: Lactic Acid, Venous: 3.77 mmol/L (ref 0.5–1.9)

## 2018-11-02 LAB — RPR: RPR Ser Ql: NONREACTIVE

## 2018-11-02 MED ORDER — SODIUM CHLORIDE 0.9 % IV SOLN
INTRAVENOUS | Status: AC | PRN
  Administered 2018-11-02: 1000 mL via INTRAVENOUS

## 2018-11-02 MED ORDER — TETANUS-DIPHTH-ACELL PERTUSSIS 5-2.5-18.5 LF-MCG/0.5 IM SUSP
0.5000 mL | Freq: Once | INTRAMUSCULAR | Status: AC
  Administered 2018-11-03: 0.5 mL via INTRAMUSCULAR
  Filled 2018-11-02: qty 0.5

## 2018-11-02 MED ORDER — IOPAMIDOL (ISOVUE-370) INJECTION 76%
100.0000 mL | Freq: Once | INTRAVENOUS | Status: AC | PRN
  Administered 2018-11-02: 100 mL via INTRAVENOUS

## 2018-11-02 MED ORDER — CEFAZOLIN SODIUM-DEXTROSE 2-4 GM/100ML-% IV SOLN
2.0000 g | Freq: Once | INTRAVENOUS | Status: AC
  Administered 2018-11-03: 2 g via INTRAVENOUS
  Filled 2018-11-02: qty 100

## 2018-11-02 MED ORDER — FENTANYL CITRATE (PF) 100 MCG/2ML IJ SOLN
INTRAMUSCULAR | Status: AC
  Filled 2018-11-02: qty 2

## 2018-11-02 MED ORDER — SODIUM CHLORIDE 0.9 % IV SOLN
10.0000 mL/h | Freq: Once | INTRAVENOUS | Status: AC
  Administered 2018-11-03: 10 mL/h via INTRAVENOUS

## 2018-11-02 NOTE — ED Notes (Signed)
Dr Rubin PayorPickering informed of lactic acid results 3.77

## 2018-11-02 NOTE — ED Triage Notes (Signed)
Pt BIB [facility] for eval of single penetrating wound to L femur. L thigh is firm to palpation, +CSM to LLE distal to injury. Pt Reports 10/10 pain to L thigh. GCS 15.

## 2018-11-03 ENCOUNTER — Encounter (HOSPITAL_COMMUNITY): Payer: Self-pay | Admitting: Anesthesiology

## 2018-11-03 ENCOUNTER — Encounter (HOSPITAL_COMMUNITY)
Admission: EM | Disposition: A | Discharge status: Home / Self Care | Payer: Self-pay | Source: Home / Self Care | Attending: Orthopaedic Surgery

## 2018-11-03 ENCOUNTER — Inpatient Hospital Stay (HOSPITAL_COMMUNITY): Payer: Self-pay | Admitting: Anesthesiology

## 2018-11-03 ENCOUNTER — Inpatient Hospital Stay (HOSPITAL_COMMUNITY): Payer: Self-pay

## 2018-11-03 DIAGNOSIS — S71132A Puncture wound without foreign body, left thigh, initial encounter: Secondary | ICD-10-CM

## 2018-11-03 DIAGNOSIS — W3400XA Accidental discharge from unspecified firearms or gun, initial encounter: Secondary | ICD-10-CM

## 2018-11-03 HISTORY — PX: INTRAMEDULLARY (IM) NAIL INTERTROCHANTERIC: SHX5875

## 2018-11-03 HISTORY — PX: I & D EXTREMITY: SHX5045

## 2018-11-03 LAB — I-STAT CHEM 8, ED
BUN: 16 mg/dL (ref 6–20)
Calcium, Ion: 1.01 mmol/L — ABNORMAL LOW (ref 1.15–1.40)
Chloride: 105 mmol/L (ref 98–111)
Creatinine, Ser: 1.3 mg/dL — ABNORMAL HIGH (ref 0.61–1.24)
GLUCOSE: 138 mg/dL — AB (ref 70–99)
HCT: 43 % (ref 39.0–52.0)
Hemoglobin: 14.6 g/dL (ref 13.0–17.0)
Potassium: 3.2 mmol/L — ABNORMAL LOW (ref 3.5–5.1)
Sodium: 139 mmol/L (ref 135–145)
TCO2: 21 mmol/L — ABNORMAL LOW (ref 22–32)

## 2018-11-03 LAB — PREPARE FRESH FROZEN PLASMA
Unit division: 0
Unit division: 0

## 2018-11-03 LAB — BPAM FFP
BLOOD PRODUCT EXPIRATION DATE: 202001022359
Blood Product Expiration Date: 202001022359
ISSUE DATE / TIME: 201912282327
ISSUE DATE / TIME: 201912282327
Unit Type and Rh: 6200
Unit Type and Rh: 6200

## 2018-11-03 LAB — ABO/RH: ABO/RH(D): A POS

## 2018-11-03 LAB — CBC
HCT: 40.9 % (ref 39.0–52.0)
Hemoglobin: 13.7 g/dL (ref 13.0–17.0)
MCH: 29.9 pg (ref 26.0–34.0)
MCHC: 33.5 g/dL (ref 30.0–36.0)
MCV: 89.3 fL (ref 80.0–100.0)
Platelets: 262 10*3/uL (ref 150–400)
RBC: 4.58 MIL/uL (ref 4.22–5.81)
RDW: 12.6 % (ref 11.5–15.5)
WBC: 8.2 10*3/uL (ref 4.0–10.5)
nRBC: 0 % (ref 0.0–0.2)

## 2018-11-03 LAB — COMPREHENSIVE METABOLIC PANEL
ALT: 24 U/L (ref 0–44)
AST: 45 U/L — AB (ref 15–41)
Albumin: 4 g/dL (ref 3.5–5.0)
Alkaline Phosphatase: 52 U/L (ref 38–126)
Anion gap: 13 (ref 5–15)
BUN: 14 mg/dL (ref 6–20)
CHLORIDE: 104 mmol/L (ref 98–111)
CO2: 22 mmol/L (ref 22–32)
CREATININE: 1.14 mg/dL (ref 0.61–1.24)
Calcium: 8.7 mg/dL — ABNORMAL LOW (ref 8.9–10.3)
GFR calc Af Amer: 45 mL/min — ABNORMAL LOW (ref >60)
GFR calc non Af Amer: 39 mL/min — ABNORMAL LOW (ref >60)
Glucose, Bld: 142 mg/dL — ABNORMAL HIGH (ref 70–99)
Potassium: 3.3 mmol/L — ABNORMAL LOW (ref 3.5–5.1)
Sodium: 139 mmol/L (ref 135–145)
Total Bilirubin: 0.9 mg/dL (ref 0.3–1.2)
Total Protein: 7.4 g/dL (ref 6.5–8.1)

## 2018-11-03 LAB — ETHANOL: Alcohol, Ethyl (B): 133 mg/dL — ABNORMAL HIGH (ref <10)

## 2018-11-03 LAB — SURGICAL PCR SCREEN
MRSA, PCR: NEGATIVE
Staphylococcus aureus: NEGATIVE

## 2018-11-03 LAB — CDS SEROLOGY

## 2018-11-03 LAB — PROTIME-INR
INR: 1.03
Prothrombin Time: 13.4 seconds (ref 11.4–15.2)

## 2018-11-03 LAB — PREPARE RBC (CROSSMATCH)

## 2018-11-03 SURGERY — FIXATION, FRACTURE, INTERTROCHANTERIC, WITH INTRAMEDULLARY ROD
Anesthesia: General | Laterality: Left

## 2018-11-03 MED ORDER — PROMETHAZINE HCL 25 MG/ML IJ SOLN: 6.2500 mg | INTRAMUSCULAR | Status: DC | PRN

## 2018-11-03 MED ORDER — SUCCINYLCHOLINE CHLORIDE 20 MG/ML IJ SOLN
INTRAMUSCULAR | Status: DC | PRN
  Administered 2018-11-03: 140 mg via INTRAVENOUS

## 2018-11-03 MED ORDER — CEFAZOLIN SODIUM-DEXTROSE 2-3 GM-%(50ML) IV SOLR
INTRAVENOUS | Status: DC | PRN
  Administered 2018-11-03: 2 g via INTRAVENOUS

## 2018-11-03 MED ORDER — ONDANSETRON HCL 4 MG/2ML IJ SOLN
INTRAMUSCULAR | Status: DC | PRN
  Administered 2018-11-03: 4 mg via INTRAVENOUS

## 2018-11-03 MED ORDER — MEPERIDINE HCL 50 MG/ML IJ SOLN: 6.2500 mg | INTRAMUSCULAR | Status: DC | PRN

## 2018-11-03 MED ORDER — METOCLOPRAMIDE HCL 5 MG PO TABS: 5.0000 mg | ORAL_TABLET | Freq: Three times a day (TID) | ORAL | Status: DC | PRN

## 2018-11-03 MED ORDER — LACTATED RINGERS IV SOLN
INTRAVENOUS | Status: DC | PRN
  Administered 2018-11-03 (×2): via INTRAVENOUS

## 2018-11-03 MED ORDER — SODIUM CHLORIDE 0.9 % IR SOLN
Status: DC | PRN
  Administered 2018-11-03: 1000 mL

## 2018-11-03 MED ORDER — ACETAMINOPHEN 500 MG PO TABS
1000.0000 mg | ORAL_TABLET | Freq: Four times a day (QID) | ORAL | Status: DC
  Administered 2018-11-03 – 2018-11-08 (×19): 1000 mg via ORAL
  Filled 2018-11-03 (×20): qty 2

## 2018-11-03 MED ORDER — PROPOFOL 10 MG/ML IV BOLUS
INTRAVENOUS | Status: AC
  Filled 2018-11-03: qty 20

## 2018-11-03 MED ORDER — FENTANYL CITRATE (PF) 250 MCG/5ML IJ SOLN
INTRAMUSCULAR | Status: AC
  Filled 2018-11-03: qty 5

## 2018-11-03 MED ORDER — GABAPENTIN 300 MG PO CAPS
300.0000 mg | ORAL_CAPSULE | Freq: Three times a day (TID) | ORAL | Status: DC
  Administered 2018-11-03 – 2018-11-08 (×15): 300 mg via ORAL
  Filled 2018-11-03 (×16): qty 1

## 2018-11-03 MED ORDER — OXYCODONE HCL 5 MG PO TABS
5.0000 mg | ORAL_TABLET | ORAL | Status: DC | PRN
  Administered 2018-11-03 – 2018-11-08 (×27): 10 mg via ORAL
  Filled 2018-11-03 (×28): qty 2

## 2018-11-03 MED ORDER — MIDAZOLAM HCL 5 MG/5ML IJ SOLN
INTRAMUSCULAR | Status: DC | PRN
  Administered 2018-11-03 (×2): 1 mg via INTRAVENOUS

## 2018-11-03 MED ORDER — SUGAMMADEX SODIUM 200 MG/2ML IV SOLN
INTRAVENOUS | Status: DC | PRN
  Administered 2018-11-03: 200 mg via INTRAVENOUS

## 2018-11-03 MED ORDER — DOCUSATE SODIUM 100 MG PO CAPS
100.0000 mg | ORAL_CAPSULE | Freq: Two times a day (BID) | ORAL | Status: DC
  Administered 2018-11-03 – 2018-11-08 (×11): 100 mg via ORAL
  Filled 2018-11-03 (×11): qty 1

## 2018-11-03 MED ORDER — ACETAMINOPHEN 325 MG PO TABS
650.0000 mg | ORAL_TABLET | Freq: Four times a day (QID) | ORAL | Status: DC | PRN
  Administered 2018-11-06: 650 mg via ORAL
  Filled 2018-11-03 (×2): qty 2

## 2018-11-03 MED ORDER — CEFAZOLIN SODIUM-DEXTROSE 2-4 GM/100ML-% IV SOLN
INTRAVENOUS | Status: AC
  Filled 2018-11-03: qty 100

## 2018-11-03 MED ORDER — OXYCODONE HCL 5 MG/5ML PO SOLN: 5.0000 mg | Freq: Once | ORAL | Status: DC | PRN

## 2018-11-03 MED ORDER — STERILE WATER FOR IRRIGATION IR SOLN
Status: DC | PRN
  Administered 2018-11-03: 1000 mL

## 2018-11-03 MED ORDER — FENTANYL CITRATE (PF) 100 MCG/2ML IJ SOLN
50.0000 ug | Freq: Once | INTRAMUSCULAR | Status: AC
  Administered 2018-11-02: via INTRAVENOUS
  Administered 2018-11-03: 50 ug via INTRAVENOUS

## 2018-11-03 MED ORDER — METOCLOPRAMIDE HCL 5 MG/ML IJ SOLN: 5.0000 mg | Freq: Three times a day (TID) | INTRAMUSCULAR | Status: DC | PRN

## 2018-11-03 MED ORDER — ONDANSETRON HCL 4 MG PO TABS: 4.0000 mg | ORAL_TABLET | Freq: Four times a day (QID) | ORAL | Status: DC | PRN

## 2018-11-03 MED ORDER — MIDAZOLAM HCL 2 MG/2ML IJ SOLN
INTRAMUSCULAR | Status: AC
  Filled 2018-11-03: qty 2

## 2018-11-03 MED ORDER — LIDOCAINE HCL (CARDIAC) PF 100 MG/5ML IV SOSY
PREFILLED_SYRINGE | INTRAVENOUS | Status: DC | PRN
  Administered 2018-11-03: 100 mg via INTRATRACHEAL

## 2018-11-03 MED ORDER — DOCUSATE SODIUM 100 MG PO CAPS: 100.0000 mg | ORAL_CAPSULE | Freq: Two times a day (BID) | ORAL | Status: DC

## 2018-11-03 MED ORDER — WHITE PETROLATUM EX OINT
TOPICAL_OINTMENT | CUTANEOUS | Status: AC
  Administered 2018-11-03: 05:00:00
  Filled 2018-11-03: qty 28.35

## 2018-11-03 MED ORDER — FENTANYL CITRATE (PF) 100 MCG/2ML IJ SOLN
50.0000 ug | Freq: Once | INTRAMUSCULAR | Status: AC
  Administered 2018-11-03: 50 ug via INTRAVENOUS

## 2018-11-03 MED ORDER — FENTANYL CITRATE (PF) 250 MCG/5ML IJ SOLN
INTRAMUSCULAR | Status: DC | PRN
  Administered 2018-11-03 (×3): 50 ug via INTRAVENOUS
  Administered 2018-11-03: 100 ug via INTRAVENOUS
  Administered 2018-11-03 (×3): 50 ug via INTRAVENOUS

## 2018-11-03 MED ORDER — MORPHINE SULFATE (PF) 2 MG/ML IV SOLN
2.0000 mg | INTRAVENOUS | Status: DC | PRN
  Administered 2018-11-03 – 2018-11-08 (×9): 2 mg via INTRAVENOUS
  Filled 2018-11-03 (×9): qty 1

## 2018-11-03 MED ORDER — CEFAZOLIN SODIUM-DEXTROSE 2-4 GM/100ML-% IV SOLN
2.0000 g | Freq: Four times a day (QID) | INTRAVENOUS | Status: AC
  Administered 2018-11-03 – 2018-11-04 (×3): 2 g via INTRAVENOUS
  Filled 2018-11-03 (×3): qty 100

## 2018-11-03 MED ORDER — KETOROLAC TROMETHAMINE 15 MG/ML IJ SOLN
15.0000 mg | Freq: Four times a day (QID) | INTRAMUSCULAR | Status: AC
  Administered 2018-11-03 – 2018-11-07 (×19): 15 mg via INTRAVENOUS
  Filled 2018-11-03 (×19): qty 1

## 2018-11-03 MED ORDER — PROPOFOL 10 MG/ML IV BOLUS
INTRAVENOUS | Status: DC | PRN
  Administered 2018-11-03: 200 mg via INTRAVENOUS

## 2018-11-03 MED ORDER — DIPHENHYDRAMINE HCL 50 MG/ML IJ SOLN
INTRAMUSCULAR | Status: DC | PRN
  Administered 2018-11-03: 25 mg via INTRAVENOUS

## 2018-11-03 MED ORDER — FENTANYL CITRATE (PF) 100 MCG/2ML IJ SOLN
100.0000 ug | Freq: Once | INTRAMUSCULAR | Status: AC
  Administered 2018-11-03: 100 ug via INTRAVENOUS
  Filled 2018-11-03: qty 2

## 2018-11-03 MED ORDER — HYDROMORPHONE HCL 1 MG/ML IJ SOLN: 0.2500 mg | INTRAMUSCULAR | Status: DC | PRN

## 2018-11-03 MED ORDER — OXYCODONE HCL 5 MG PO TABS: 5.0000 mg | ORAL_TABLET | Freq: Once | ORAL | Status: DC | PRN

## 2018-11-03 MED ORDER — ROCURONIUM BROMIDE 100 MG/10ML IV SOLN
INTRAVENOUS | Status: DC | PRN
  Administered 2018-11-03: 20 mg via INTRAVENOUS

## 2018-11-03 MED ORDER — CEFAZOLIN SODIUM-DEXTROSE 2-4 GM/100ML-% IV SOLN: 2.0000 g | INTRAVENOUS | Status: DC

## 2018-11-03 MED ORDER — FENTANYL CITRATE (PF) 100 MCG/2ML IJ SOLN
INTRAMUSCULAR | Status: AC
  Administered 2018-11-03: 50 ug via INTRAVENOUS
  Filled 2018-11-03: qty 2

## 2018-11-03 MED ORDER — ONDANSETRON HCL 4 MG/2ML IJ SOLN: 4.0000 mg | Freq: Four times a day (QID) | INTRAMUSCULAR | Status: DC | PRN

## 2018-11-03 MED ORDER — LACTATED RINGERS IV SOLN
INTRAVENOUS | Status: DC
  Administered 2018-11-03: 09:00:00 via INTRAVENOUS

## 2018-11-03 MED ORDER — ACETAMINOPHEN 650 MG RE SUPP: 650.0000 mg | Freq: Four times a day (QID) | RECTAL | Status: DC | PRN

## 2018-11-03 SURGICAL SUPPLY — 85 items
ALCOHOL 70% 16 OZ (MISCELLANEOUS) ×3 IMPLANT
BANDAGE ACE 4X5 VEL STRL LF (GAUZE/BANDAGES/DRESSINGS) IMPLANT
BIT DRILL AO GAMMA 4.2X180 (BIT) ×2 IMPLANT
BLADE CLIPPER SURG (BLADE) IMPLANT
BLADE SURG 15 STRL LF DISP TIS (BLADE) ×1 IMPLANT
BLADE SURG 15 STRL SS (BLADE) ×3
BNDG COHESIVE 4X5 TAN STRL (GAUZE/BANDAGES/DRESSINGS) IMPLANT
BNDG GAUZE ELAST 4 BULKY (GAUZE/BANDAGES/DRESSINGS) IMPLANT
COVER PERINEAL POST (MISCELLANEOUS) ×3 IMPLANT
COVER PROBE W GEL 5X96 (DRAPES) ×2 IMPLANT
COVER SURGICAL LIGHT HANDLE (MISCELLANEOUS) ×3 IMPLANT
COVER WAND RF STERILE (DRAPES) ×3 IMPLANT
CUFF TOURNIQUET SINGLE 18IN (TOURNIQUET CUFF) IMPLANT
CUFF TOURNIQUET SINGLE 24IN (TOURNIQUET CUFF) IMPLANT
CUFF TOURNIQUET SINGLE 34IN LL (TOURNIQUET CUFF) IMPLANT
CUFF TOURNIQUET SINGLE 44IN (TOURNIQUET CUFF) IMPLANT
DRAPE HALF SHEET 40X57 (DRAPES) IMPLANT
DRAPE ORTHO SPLIT 77X108 STRL (DRAPES) ×3
DRAPE STERI IOBAN 125X83 (DRAPES) ×3 IMPLANT
DRAPE SURG ORHT 6 SPLT 77X108 (DRAPES) IMPLANT
DRAPE U-SHAPE 47X51 STRL (DRAPES) ×3 IMPLANT
DRSG MEPILEX BORDER 4X4 (GAUZE/BANDAGES/DRESSINGS) ×6 IMPLANT
DRSG MEPILEX BORDER 4X8 (GAUZE/BANDAGES/DRESSINGS) ×3 IMPLANT
DRSG PAD ABDOMINAL 8X10 ST (GAUZE/BANDAGES/DRESSINGS) IMPLANT
DURAPREP 26ML APPLICATOR (WOUND CARE) ×3 IMPLANT
ELECT REM PT RETURN 9FT ADLT (ELECTROSURGICAL) ×3
ELECTRODE REM PT RTRN 9FT ADLT (ELECTROSURGICAL) ×1 IMPLANT
FACESHIELD WRAPAROUND (MASK) ×3 IMPLANT
FACESHIELD WRAPAROUND OR TEAM (MASK) ×1 IMPLANT
GAUZE SPONGE 4X4 12PLY STRL (GAUZE/BANDAGES/DRESSINGS) IMPLANT
GAUZE XEROFORM 5X9 LF (GAUZE/BANDAGES/DRESSINGS) ×3 IMPLANT
GLOVE BIOGEL PI IND STRL 7.5 (GLOVE) ×1 IMPLANT
GLOVE BIOGEL PI IND STRL 8 (GLOVE) ×1 IMPLANT
GLOVE BIOGEL PI INDICATOR 7.5 (GLOVE) ×2
GLOVE BIOGEL PI INDICATOR 8 (GLOVE) ×2
GLOVE ECLIPSE 7.0 STRL STRAW (GLOVE) ×3 IMPLANT
GLOVE SURG ORTHO 8.0 STRL STRW (GLOVE) ×3 IMPLANT
GOWN STRL REUS W/ TWL LRG LVL3 (GOWN DISPOSABLE) ×2 IMPLANT
GOWN STRL REUS W/ TWL XL LVL3 (GOWN DISPOSABLE) ×1 IMPLANT
GOWN STRL REUS W/TWL LRG LVL3 (GOWN DISPOSABLE) ×6
GOWN STRL REUS W/TWL XL LVL3 (GOWN DISPOSABLE) ×3
GUIDEROD T2 3X1000 (ROD) ×2 IMPLANT
HANDPIECE INTERPULSE COAX TIP (DISPOSABLE)
K-WIRE RECON 3.2X400 (WIRE) ×3
KIT BASIN OR (CUSTOM PROCEDURE TRAY) ×3 IMPLANT
KIT TURNOVER KIT B (KITS) ×3 IMPLANT
KWIRE RECON 3.2X400 (WIRE) IMPLANT
LINER BOOT UNIVERSAL DISP (MISCELLANEOUS) ×3 IMPLANT
MANIFOLD NEPTUNE II (INSTRUMENTS) ×3 IMPLANT
NAIL LOCK IM 9X1.5X420 125D (Nail) ×2 IMPLANT
NS IRRIG 1000ML POUR BTL (IV SOLUTION) ×3 IMPLANT
PACK GENERAL/GYN (CUSTOM PROCEDURE TRAY) ×3 IMPLANT
PACK ORTHO EXTREMITY (CUSTOM PROCEDURE TRAY) ×3 IMPLANT
PAD ARMBOARD 7.5X6 YLW CONV (MISCELLANEOUS) ×6 IMPLANT
PAD CAST 4YDX4 CTTN HI CHSV (CAST SUPPLIES) IMPLANT
PADDING CAST COTTON 4X4 STRL (CAST SUPPLIES)
REAMER INTRAMEDULLARY 8MM 510 (MISCELLANEOUS) ×2 IMPLANT
REAMER SHAFT BIXCUT (INSTRUMENTS) ×2 IMPLANT
SCREW LAG RECON 6.5X90MM (Screw) ×2 IMPLANT
SCREW LAG RECON 6.5X95MM (Screw) ×2 IMPLANT
SCREW LOCKING FULL THREAD 5X52 (Screw) ×2 IMPLANT
SCREW LOCKING THREADED 5X47.5 (Screw) ×2 IMPLANT
SET HNDPC FAN SPRY TIP SCT (DISPOSABLE) IMPLANT
SPONGE LAP 18X18 X RAY DECT (DISPOSABLE) IMPLANT
SPONGE LAP 4X18 RFD (DISPOSABLE) IMPLANT
STAPLER VISISTAT 35W (STAPLE) ×5 IMPLANT
STEPDRILL FOR LAG SCREW RECON (DRILL) ×4 IMPLANT
STOCKINETTE IMPERVIOUS 9X36 MD (GAUZE/BANDAGES/DRESSINGS) IMPLANT
SUT ETHILON 2 0 FS 18 (SUTURE) IMPLANT
SUT ETHILON 3 0 PS 1 (SUTURE) IMPLANT
SUT MON AB 2-0 CT1 36 (SUTURE) ×4 IMPLANT
SUT PDS AB 2-0 CT1 27 (SUTURE) ×2 IMPLANT
SUT VIC AB 2-0 CTB1 (SUTURE) ×3 IMPLANT
SUT VIC AB 3-0 SH 27 (SUTURE)
SUT VIC AB 3-0 SH 27X BRD (SUTURE) IMPLANT
SWAB CULTURE ESWAB REG 1ML (MISCELLANEOUS) IMPLANT
SWAB CULTURE LIQ STUART DBL (MISCELLANEOUS) IMPLANT
TAPE STRIPS DRAPE STRL (GAUZE/BANDAGES/DRESSINGS) IMPLANT
TOWEL OR 17X24 6PK STRL BLUE (TOWEL DISPOSABLE) ×3 IMPLANT
TOWEL OR 17X26 10 PK STRL BLUE (TOWEL DISPOSABLE) ×3 IMPLANT
TUBE CONNECTING 12'X1/4 (SUCTIONS) ×1
TUBE CONNECTING 12X1/4 (SUCTIONS) ×2 IMPLANT
UNDERPAD 30X30 (UNDERPADS AND DIAPERS) ×3 IMPLANT
WATER STERILE IRR 1000ML POUR (IV SOLUTION) ×3 IMPLANT
YANKAUER SUCT BULB TIP NO VENT (SUCTIONS) ×3 IMPLANT

## 2018-11-03 NOTE — ED Notes (Signed)
Attempted report, call back number given.
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
## 2018-11-03 NOTE — Transfer of Care (Signed)
Immediate Anesthesia Transfer of Care Note  Patient: Jacob Rogers  Procedure(s) Performed: INTRAMEDULLARY (IM) NAIL, LEFT FEMUR (Left ) IRRIGATION AND DEBRIDEMENT, OPEN FRACTURE (Left )  Patient Location: PACU  Anesthesia Type:General  Level of Consciousness: awake  Airway & Oxygen Therapy: Patient Spontanous Breathing  Post-op Assessment: Report given to RN and Post -op Vital signs reviewed and stable  Post vital signs: Reviewed and stable  Last Vitals:  Vitals Value Taken Time  BP 139/101 11/03/2018  1:05 PM  Temp 36.7 C 11/03/2018  1:05 PM  Pulse 115 11/03/2018  1:05 PM  Resp 12 11/03/2018  1:05 PM  SpO2 97 % 11/03/2018  1:05 PM  Vitals shown include unvalidated device data.  Last Pain:  Vitals:   11/03/18 1305  TempSrc:   PainSc: (P) Asleep         Complications: No apparent anesthesia complications
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
## 2018-11-03 NOTE — Progress Notes (Signed)
VS taken in room 6n06 on arrival to floor from PACU.

## 2018-11-03 NOTE — Brief Op Note (Signed)
11/03/2018  12:56 PM  PATIENT:  Jacob Rogers  32 y.o. male  PRE-OPERATIVE DIAGNOSIS:  LEFT SUBTROCH FEMUR OPEN FRACTURE  POST-OPERATIVE DIAGNOSIS:  LEFT SUBTROCH FEMUR OPEN FRACTURE  PROCEDURE:  Procedure(s): INTRAMEDULLARY (IM) NAIL, LEFT FEMUR (Left) IRRIGATION AND DEBRIDEMENT, OPEN FRACTURE (Left)  SURGEON:  Surgeon(s) and Role:    Terance Hart* Adair, Christopher R, MD - Primary  PHYSICIAN ASSISTANT:   ASSISTANTS: none   ANESTHESIA:   general  EBL:  300 mL   BLOOD ADMINISTERED:none  DRAINS: none   LOCAL MEDICATIONS USED:  NONE  SPECIMEN:  No Specimen  DISPOSITION OF SPECIMEN:  N/A  COUNTS:  YES  TOURNIQUET:  * No tourniquets in log *  DICTATION: .Dragon Dictation  PLAN OF CARE: Admit to inpatient   PATIENT DISPOSITION:  PACU - hemodynamically stable.   Delay start of Pharmacological VTE agent (>24hrs) due to surgical blood loss or risk of bleeding: yes

## 2018-11-03 NOTE — Anesthesia Postprocedure Evaluation (Signed)
Anesthesia Post Note  Patient: Jacob DolphinJonathan C Hoffmeier  Procedure(s) Performed: INTRAMEDULLARY (IM) NAIL, LEFT FEMUR (Left ) IRRIGATION AND DEBRIDEMENT, OPEN FRACTURE (Left )     Patient location during evaluation: PACU Anesthesia Type: General Level of consciousness: sedated and patient cooperative Pain management: pain level controlled Vital Signs Assessment: post-procedure vital signs reviewed and stable Respiratory status: spontaneous breathing Cardiovascular status: stable Anesthetic complications: no    Last Vitals:  Vitals:   11/03/18 1319 11/03/18 1335  BP: 133/83 127/86  Pulse: (!) 105 (!) 107  Resp: 13 14  Temp: 36.7 C 37.1 C  SpO2: 97% 98%    Last Pain:  Vitals:   11/03/18 1706  TempSrc:   PainSc: 7                  Lewie LoronJohn Shauna Bodkins

## 2018-11-03 NOTE — Anesthesia Procedure Notes (Signed)
Procedure Name: Intubation Date/Time: 11/03/2018 10:34 AM Performed by: Claudina LickMahony, Tanetta Fuhriman D, CRNA Pre-anesthesia Checklist: Patient identified, Emergency Drugs available, Suction available, Patient being monitored and Timeout performed Patient Re-evaluated:Patient Re-evaluated prior to induction Oxygen Delivery Method: Circle system utilized Preoxygenation: Pre-oxygenation with 100% oxygen Induction Type: IV induction and Rapid sequence Laryngoscope Size: Miller and 2 Grade View: Grade I Tube type: Oral Tube size: 7.5 mm Number of attempts: 1 Airway Equipment and Method: Stylet Placement Confirmation: ETT inserted through vocal cords under direct vision,  positive ETCO2 and breath sounds checked- equal and bilateral Secured at: 22 cm Tube secured with: Tape Dental Injury: Teeth and Oropharynx as per pre-operative assessment

## 2018-11-03 NOTE — Anesthesia Preprocedure Evaluation (Addendum)
Anesthesia Evaluation  Patient identified by MRN, date of birth, ID band Patient awake    Reviewed: Allergy & Precautions, NPO status , Patient's Chart, lab work & pertinent test results  Airway Mallampati: I  TM Distance: >3 FB Neck ROM: Full    Dental  (+) Dental Advisory Given, Teeth Intact   Pulmonary neg pulmonary ROS,    Pulmonary exam normal breath sounds clear to auscultation       Cardiovascular negative cardio ROS Normal cardiovascular exam Rhythm:Regular Rate:Normal     Neuro/Psych negative neurological ROS  negative psych ROS   GI/Hepatic negative GI ROS, Neg liver ROS,   Endo/Other  negative endocrine ROS  Renal/GU negative Renal ROS     Musculoskeletal negative musculoskeletal ROS (+)   Abdominal   Peds  Hematology negative hematology ROS (+)   Anesthesia Other Findings   Reproductive/Obstetrics                             Anesthesia Physical Anesthesia Plan  ASA: I  Anesthesia Plan: General   Post-op Pain Management:    Induction: Intravenous  PONV Risk Score and Plan:   Airway Management Planned: Oral ETT  Additional Equipment:   Intra-op Plan:   Post-operative Plan: Extubation in OR  Informed Consent: I have reviewed the patients History and Physical, chart, labs and discussed the procedure including the risks, benefits and alternatives for the proposed anesthesia with the patient or authorized representative who has indicated his/her understanding and acceptance.   Dental advisory given  Plan Discussed with: CRNA, Surgeon and Anesthesiologist  Anesthesia Plan Comments:        Anesthesia Quick Evaluation
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
## 2018-11-04 LAB — URINALYSIS, ROUTINE W REFLEX MICROSCOPIC
Bilirubin Urine: NEGATIVE
Glucose, UA: 50 mg/dL — AB
Hgb urine dipstick: NEGATIVE
Ketones, ur: NEGATIVE mg/dL
Leukocytes, UA: NEGATIVE
Nitrite: NEGATIVE
PH: 6 (ref 5.0–8.0)
Protein, ur: NEGATIVE mg/dL
Specific Gravity, Urine: 1.025 (ref 1.005–1.030)

## 2018-11-04 LAB — BLOOD PRODUCT ORDER (VERBAL) VERIFICATION

## 2018-11-04 LAB — GC/CHLAMYDIA PROBE AMP (~~LOC~~) NOT AT ARMC
Chlamydia: NEGATIVE
Neisseria Gonorrhea: NEGATIVE

## 2018-11-04 MED ORDER — ENOXAPARIN SODIUM 40 MG/0.4ML ~~LOC~~ SOLN
40.0000 mg | Freq: Every day | SUBCUTANEOUS | Status: DC
  Administered 2018-11-04: 40 mg via SUBCUTANEOUS
  Filled 2018-11-04 (×3): qty 0.4
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
## 2018-11-04 NOTE — Plan of Care (Signed)
  Problem: Education: Goal: Knowledge of General Education information will improve Description Including pain rating scale, medication(s)/side effects and non-pharmacologic comfort measures Outcome: Progressing   Problem: Clinical Measurements: Goal: Ability to maintain clinical measurements within normal limits will improve Outcome: Progressing Goal: Will remain free from infection Outcome: Progressing Goal: Respiratory complications will improve Outcome: Progressing Goal: Cardiovascular complication will be avoided Outcome: Progressing   Problem: Activity: Goal: Risk for activity intolerance will decrease Outcome: Progressing   Problem: Nutrition: Goal: Adequate nutrition will be maintained Outcome: Progressing   Problem: Coping: Goal: Level of anxiety will decrease Outcome: Progressing   Problem: Elimination: Goal: Will not experience complications related to urinary retention Outcome: Progressing   Problem: Safety: Goal: Ability to remain free from injury will improve Outcome: Progressing   Problem: Skin Integrity: Goal: Risk for impaired skin integrity will decrease Outcome: Progressing   Problem: Surgery Discharge Goal: Self-care - activities of daily living Description Ability to perform basic physical tasks and personal care activities.   Outcome: Progressing

## 2018-11-04 NOTE — Progress Notes (Signed)
Orthopedic Tech Progress Note Patient Details:  Curlene DolphinJonathan C Merryfield February 03, 1986 [id number]  Ortho Devices Type of Ortho Device: Prafo boot/shoe   Post Interventions Patient Tolerated: Well Instructions Provided: Care of device   Saul FordyceJennifer C Alfonza Toft 11/04/2018, 11:25 AM
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
## 2018-11-05 LAB — CBC
HCT: 18.3 % — ABNORMAL LOW (ref 39.0–52.0)
Hemoglobin: 6.1 g/dL — CL (ref 13.0–17.0)
MCH: 29.3 pg (ref 26.0–34.0)
MCHC: 33.3 g/dL (ref 30.0–36.0)
MCV: 88 fL (ref 80.0–100.0)
Platelets: 109 10*3/uL — ABNORMAL LOW (ref 150–400)
RBC: 2.08 MIL/uL — ABNORMAL LOW (ref 4.22–5.81)
RDW: 13.1 % (ref 11.5–15.5)
WBC: 7.1 10*3/uL (ref 4.0–10.5)
nRBC: 0 % (ref 0.0–0.2)

## 2018-11-05 LAB — PREPARE RBC (CROSSMATCH)

## 2018-11-05 MED ORDER — SODIUM CHLORIDE 0.9% IV SOLUTION: Freq: Once | INTRAVENOUS | Status: DC

## 2018-11-05 MED ORDER — GABAPENTIN 300 MG PO CAPS: 300.0000 mg | ORAL_CAPSULE | Freq: Three times a day (TID) | ORAL | 0 refills | Status: DC

## 2018-11-05 MED ORDER — OXYCODONE HCL 5 MG PO TABS: 5.0000 mg | ORAL_TABLET | ORAL | 0 refills | Status: AC | PRN

## 2018-11-05 MED ORDER — DOCUSATE SODIUM 100 MG PO CAPS: 100.0000 mg | ORAL_CAPSULE | Freq: Two times a day (BID) | ORAL | 0 refills | Status: DC

## 2018-11-05 MED ORDER — ENOXAPARIN SODIUM 40 MG/0.4ML ~~LOC~~ SOLN: 40.0000 mg | Freq: Every day | SUBCUTANEOUS | 0 refills | Status: DC

## 2018-11-05 MED FILL — ENOXAPARIN 40 MG/0.4 ML SYR: 40 | 14 days supply | Qty: 6 | Fill #0

## 2018-11-05 MED FILL — oxyCODONE HCL 5 MG TABS: 5 | 4 days supply | Qty: 50 | Fill #0

## 2018-11-05 MED FILL — GABAPENTIN 300 MG CAPSULE: 300 | 30 days supply | Qty: 90 | Fill #0

## 2018-11-05 NOTE — Progress Notes (Signed)
Orthopedic Tech Progress Note Patient Details:  Curlene DolphinJonathan C Tofte May 03, 1986 [id number]  Patient ID: Curlene DolphinJonathan C Shough, male   DOB: May 03, 1986, 32 y.o.   MRN: [id number] Called in order to bio tech.  Trinna PostMartinez, Erynne Kealey J 11/05/2018, 10:11 AM

## 2018-11-05 NOTE — Progress Notes (Signed)
At end of 1st unit of PRBC Pt stated that he was having hot and cold spells with sweats.   MD notified - MD stated to hold the 2nd unit of PRBC and repeat CBC in the AM and he will up to see the Pt sometime this afternoon.
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
## 2018-11-05 NOTE — Progress Notes (Signed)
Orthopedic Tech Progress Note Patient Details:  Jacob Rogers [birth year] [id number]  Ortho Devices Type of Ortho Device: Crutches Ortho Device/Splint Location: delivered to room Ortho Device/Splint Interventions: Ordered   Post Interventions Patient Tolerated: Well Instructions Provided: Care of device   Trinna PostMartinez, Saira Kramme J 11/05/2018, 10:11 AM
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
## 2018-11-05 NOTE — Progress Notes (Signed)
Presented to bedside to evaluate the patient this p.m.  CBC this morning showed hemoglobin of 6.1.  Ordered to transfuse 2 units of hemoglobin as he was becoming tachycardic and short of breath with physical activity and held off on discharge.  After 1 unit of PRBCs patient was complaining of some hot and cold sweats.  I order to cease transfusion after 1 unit.  On evaluation currently patient is resting comfortably in a chair with family at bedside.  Dressings are intact without saturations.  Patient denies any current shortness of breath or dizziness.  Patient states he was able to ambulate well in the hall without getting too tired earlier.  I compressed the lateral thigh as there was concern that it was increasing in diameter.  It was actually softer than it was prior to surgical intervention on Sunday.  He has no significant tenderness to palpation about the anterior lateral medial or posterior thighs.  It is definitely swollen.  Patient has had no change in his motor and sensation distally.  He continues with foot drop and paresthesias to the dorsal and plantar surface of his foot.  He does have intact ankle plantarflexion.  I had a lengthy conversation with the patient and his family tonight.  We discussed watchful waiting for his nerve injury and continued physical therapy.  We will check a CBC in the morning.
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
## 2018-11-05 NOTE — Care Management Note (Signed)
Case Management Note  Patient Details  Name: [name] MRN: [id number] Date of Birth: [birth year]  Subjective/Objective:                    Action/Plan:  Spoke to patient at bedside. Explained [facility] program and [facility]. Will await PT eval today .  Patient voiced understanding  Expected Discharge Date:  11/05/18               Expected Discharge Plan:     In-House Referral:  Financial Counselor  Discharge planning Services  CM Consult, [facility] Program, [facility], Medication Assistance  Post Acute Care Choice:  NA Choice offered to:  Patient  DME Arranged:    DME Agency:     HH Arranged:    HH Agency:     Status of Service:  In process, will continue to follow  If discussed at Long Length of Stay Meetings, dates discussed:    Additional Comments:  Kingsley PlanWile, Sherise Geerdes Marie, RN 11/05/2018, 7:49 AM

## 2018-11-05 NOTE — Progress Notes (Addendum)
Jacob Rogers Cancellation Note  Patient Details Name: Jacob Rogers MRN: [id number] DOB: 10-Apr-1986   Cancelled Treatment:    Reason Eval/Treat Not Completed: Other (comment)   Noted Hgb 6.1; discussed with Jacob ChessmanSuzanne, Jacob Rogers -- if Jacob Rogers opts for Jacob Rogers to receive blood, will hold Jacob Rogers until after;   Discussed case with Jacob Rogers, Ortho Tech -- currently has a PRAFO on LLE, which will work for optimal positioning as a night splint; Jacob Rogers will [employer] for a longer term AFO;   I'm planning to return later this afternoon for mobility/gait, depending on [name] activity tolerance;  Jacob Rogers, Jacob Rogers  Acute Rehabilitation Services Pager [phone] Office [phone]    Jacob Rogers 11/05/2018, 10:27 AM

## 2018-11-05 NOTE — Progress Notes (Signed)
OT Cancellation Note  Patient Details Name: Jacob DolphinJonathan C Shisler MRN: [id number] DOB: 01/16/1986   Cancelled Treatment:    Reason Eval/Treat Not Completed: Medical issues which prohibited therapy(Pt with low Hgb; transfusing now. LLE swelling.) Pt receiving 1 out 2 RBC units at this time requesting to be seen later. Pt refused Adaptive equipment training at this time. OT to continued to follow-up with pt when applicable.  Revonda StandardAllison Cecil Cranker(Jelenek) [place] OTR/L Acute Rehabilitation Services Pager: [phone] Office: [phone]   Sandrea HughsLLYSON  JELENEK 11/05/2018, 2:10 PM

## 2018-11-05 NOTE — Progress Notes (Signed)
CRITICAL VALUE ALERT  Critical Value:  Hbg 6.1  Date & Time Notied:  11/05/2018 at 8:50  Provider Notified: Dub Mikeshristopher Adair, Md  Orders Received/Actions taken: Awaiting orders from MD, notified Assistant at office, [facility].

## 2018-11-06 LAB — TYPE AND SCREEN
ABO/RH(D): A POS
ANTIBODY SCREEN: NEGATIVE
Unit division: 0
Unit division: 0
Unit division: 0
Unit division: 0

## 2018-11-06 LAB — CBC WITH DIFFERENTIAL/PLATELET
Abs Immature Granulocytes: 0.06 10*3/uL (ref 0.00–0.07)
Basophils Absolute: 0 10*3/uL (ref 0.0–0.1)
Basophils Relative: 0 %
Eosinophils Absolute: 0.3 10*3/uL (ref 0.0–0.5)
Eosinophils Relative: 4 %
HCT: 21.4 % — ABNORMAL LOW (ref 39.0–52.0)
HEMOGLOBIN: 7.2 g/dL — AB (ref 13.0–17.0)
Immature Granulocytes: 1 %
Lymphocytes Relative: 24 %
Lymphs Abs: 1.9 10*3/uL (ref 0.7–4.0)
MCH: 29.1 pg (ref 26.0–34.0)
MCHC: 33.6 g/dL (ref 30.0–36.0)
MCV: 86.6 fL (ref 80.0–100.0)
Monocytes Absolute: 0.7 10*3/uL (ref 0.1–1.0)
Monocytes Relative: 10 %
Neutro Abs: 4.7 10*3/uL (ref 1.7–7.7)
Neutrophils Relative %: 61 %
Platelets: 128 10*3/uL — ABNORMAL LOW (ref 150–400)
RBC: 2.47 MIL/uL — ABNORMAL LOW (ref 4.22–5.81)
RDW: 14.1 % (ref 11.5–15.5)
WBC: 7.7 10*3/uL (ref 4.0–10.5)
nRBC: 0 % (ref 0.0–0.2)

## 2018-11-06 LAB — BPAM RBC
Blood Product Expiration Date: 202001172359
Blood Product Expiration Date: 202001172359
Blood Product Expiration Date: 202001192359
Blood Product Expiration Date: 202001192359
ISSUE DATE / TIME: 201912282327
ISSUE DATE / TIME: 201912311129
ISSUE DATE / TIME: 201912282327
Unit Type and Rh: 6200
Unit Type and Rh: 6200
Unit Type and Rh: 9500
Unit Type and Rh: 9500

## 2018-11-06 NOTE — Plan of Care (Signed)
  Problem: Activity: Goal: Risk for activity intolerance will decrease Outcome: Progressing   

## 2018-11-06 NOTE — Progress Notes (Signed)
Subjective: 3 Days Post-Op Procedure(s) (LRB): INTRAMEDULLARY (IM) NAIL, LEFT FEMUR (Left) IRRIGATION AND DEBRIDEMENT, OPEN FRACTURE (Left) Patient is doing well this morning.  He is noting more tingling in sensations within his leg and foot today.  Denies any increased swelling or pain in his thigh.  No shortness of breath.   Objective: Vital signs in last 24 hours: Temp:  [98.4 F (36.9 C)-99.3 F (37.4 C)] 98.4 F (36.9 C) (01/01 0536) Pulse Rate:  [81-100] 81 (01/01 0536) Resp:  [16] 16 (01/01 0536) BP: (132-142)/(66-75) 136/73 (01/01 0536) SpO2:  [94 %-99 %] 96 % (01/01 0536)  Intake/Output from previous day: 12/31 0701 - 01/01 0700 In: 666 [P.O.:240; I.V.:100; Blood:326] Out: -  Intake/Output this shift: No intake/output data recorded.  Recent Labs    11/05/18 0812 11/06/18 0204  HGB 6.1* 7.2*   Recent Labs    11/05/18 0812 11/06/18 0204  WBC 7.1 7.7  RBC 2.08* 2.47*  HCT 18.3* 21.4*  PLT 109* 128*   Awake and alert. Respirations even and unlabored. Family at bedside. Evaluation of the left leg demonstrates swelling about the thigh.  Tightness to the anterior medial and posterior compartments but compressible.  No significant pain with compression.  Dressings intact with some saturations. Evaluation of the leg demonstrates continued foot drop.  He is unable to dorsiflex his ankle or invert or evert the hindfoot.  Does have intact function with ankle plantarflexion.  Paresthesias to palpation about the leg and foot dorsally and plantarly. Palpable bounding dorsalis pedis pulse.    Assessment/Plan: 3 Days Post-Op Procedure(s) (LRB): INTRAMEDULLARY (IM) NAIL, LEFT FEMUR (Left) IRRIGATION AND DEBRIDEMENT, OPEN FRACTURE (Left)  Jacob Rogers had a good response to the 1 unit of PRBCs.  His hemoglobin is 7.2 today.  He is asymptomatic.  He denies any increased pain in his thigh.  He notes more paresthesias in his foot which is a good thing and may be a sign that his  nerve is waking up.  He will continue to work on ankle motion actively.  He was given an AFO brace to use when ambulating.  He will work with physical therapy today and will see how he does.  If he becomes hypotensive, excessively tachycardic, short of breath or dizzy then we may decide to transfuse the second unit of blood.  If he feels good during therapy he may opt for discharge today.  He will begin Lovenox on discharge.  We will hold chemoprophylaxis for now as he continues to have posttraumatic and postoperative anemia.  He has no signs of compartment syndrome currently.    Jacob Rogers 11/06/2018, 9:23 AM
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
## 2018-11-07 ENCOUNTER — Encounter (HOSPITAL_COMMUNITY): Payer: Self-pay | Admitting: Orthopaedic Surgery

## 2018-11-07 NOTE — Progress Notes (Signed)
Measured patients leg due to swelling. First result was 50.5 to 51 inches at beginning of shift 8pm last night. Reading taking again this am at 0600 resulting of 49 inches.

## 2018-11-07 NOTE — Plan of Care (Signed)
  Problem: Activity: Goal: Risk for activity intolerance will decrease Outcome: Progressing   Problem: Nutrition: Goal: Adequate nutrition will be maintained Outcome: Progressing   
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
## 2018-11-07 NOTE — Plan of Care (Signed)
  Problem: Nutrition: Goal: Adequate nutrition will be maintained Outcome: Progressing   Problem: Coping: Goal: Level of anxiety will decrease Outcome: Progressing   Problem: Skin Integrity: Goal: Risk for impaired skin integrity will decrease Outcome: Progressing   

## 2018-11-08 ENCOUNTER — Encounter (HOSPITAL_COMMUNITY): Payer: Self-pay | Admitting: Emergency Medicine

## 2018-11-08 LAB — CBC WITH DIFFERENTIAL/PLATELET
Abs Immature Granulocytes: 0.12 10*3/uL — ABNORMAL HIGH (ref 0.00–0.07)
Basophils Absolute: 0.1 10*3/uL (ref 0.0–0.1)
Basophils Relative: 1 %
Eosinophils Absolute: 0.4 10*3/uL (ref 0.0–0.5)
Eosinophils Relative: 5 %
HCT: 23.7 % — ABNORMAL LOW (ref 39.0–52.0)
Hemoglobin: 7.9 g/dL — ABNORMAL LOW (ref 13.0–17.0)
Immature Granulocytes: 2 %
Lymphocytes Relative: 21 %
Lymphs Abs: 1.7 10*3/uL (ref 0.7–4.0)
MCH: 29.5 pg (ref 26.0–34.0)
MCHC: 33.3 g/dL (ref 30.0–36.0)
MCV: 88.4 fL (ref 80.0–100.0)
Monocytes Absolute: 1 10*3/uL (ref 0.1–1.0)
Monocytes Relative: 13 %
NEUTROS PCT: 58 %
Neutro Abs: 4.6 10*3/uL (ref 1.7–7.7)
PLATELETS: 203 10*3/uL (ref 150–400)
RBC: 2.68 MIL/uL — ABNORMAL LOW (ref 4.22–5.81)
RDW: 14.2 % (ref 11.5–15.5)
WBC: 7.7 10*3/uL (ref 4.0–10.5)
nRBC: 0.5 % — ABNORMAL HIGH (ref 0.0–0.2)

## 2018-11-08 NOTE — Progress Notes (Signed)
Subjective: 5 Days Post-Op Procedure(s) (LRB): INTRAMEDULLARY (IM) NAIL, LEFT FEMUR (Left) IRRIGATION AND DEBRIDEMENT, OPEN FRACTURE (Left)  Patient continues to have some burning pain in his leg as his nerve is recovering. He is been able to ambulate well.  He does get some shortness of breath with prolonged activity but overall is doing well.  Objective: Vital signs in last 24 hours: Temp:  [98.2 F (36.8 C)-98.7 F (37.1 C)] 98.5 F (36.9 C) (01/03 0418) Pulse Rate:  [77-84] 79 (01/03 0418) Resp:  [17-19] 19 (01/03 0418) BP: (136-140)/(72-81) 136/81 (01/03 0418) SpO2:  [99 %-100 %] 100 % (01/03 0418)  Intake/Output from previous day: 01/02 0701 - 01/03 0700 In: 537 [P.O.:537] Out: 300 [Urine:300] Intake/Output this shift: No intake/output data recorded.  Recent Labs    11/05/18 0812 11/06/18 0204 11/08/18 0238  HGB 6.1* 7.2* 7.9*   Recent Labs    11/06/18 0204 11/08/18 0238  WBC 7.7 7.7  RBC 2.47* 2.68*  HCT 21.4* 23.7*  PLT 128* 203   Patient is awake and alert in no acute distress Left lower extremity swelling has improved.  Soft anterior and posterior compartments.  IT band remains tight. Paresthesias to dorsal and plantar surface of the foot. Palpable dorsalis pedis pulse Patient has flicker EDL and EHL.  Flicker tib ant.    Assessment/Plan: 5 Days Post-Op Procedure(s) (LRB): INTRAMEDULLARY (IM) NAIL, LEFT FEMUR (Left) IRRIGATION AND DEBRIDEMENT, OPEN FRACTURE (Left)  Patient is doing very well.  He is having some nerve recovery.  His hemoglobin has stabled and actually rose to 7.9 today. Patient is suitable for discharge home.   He is touchdown weightbearing on the left lower extremity AFO brace is been given while his nerve recovers. He will follow-up with me in 2 weeks for x-rays and wound check. He will call the office with any concerns in the meantime or for medication refills. He will begin taking Lovenox once he gets home for DVT  prophylaxis    Terance Hart 11/08/2018, 7:41 AM

## 2018-11-08 NOTE — Progress Notes (Signed)
Patient discharged to home with instructions and equipments. 
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
## 2018-11-25 ENCOUNTER — Emergency Department (HOSPITAL_COMMUNITY)
Admission: EM | Admit: 2018-11-25 | Discharge: 2018-11-25 | Disposition: A | Discharge status: Home / Self Care | Payer: Self-pay | Attending: Emergency Medicine | Admitting: Emergency Medicine

## 2018-11-25 DIAGNOSIS — M5410 Radiculopathy, site unspecified: Secondary | ICD-10-CM | POA: Insufficient documentation

## 2018-11-25 DIAGNOSIS — Z7901 Long term (current) use of anticoagulants: Secondary | ICD-10-CM | POA: Insufficient documentation

## 2018-11-25 DIAGNOSIS — Z79899 Other long term (current) drug therapy: Secondary | ICD-10-CM | POA: Insufficient documentation

## 2018-11-25 MED ORDER — OXYCODONE-ACETAMINOPHEN 5-325 MG PO TABS
2.0000 | ORAL_TABLET | Freq: Once | ORAL | Status: AC
  Administered 2018-11-25: 2 via ORAL
  Filled 2018-11-25: qty 2

## 2018-11-25 MED ORDER — KETOROLAC TROMETHAMINE 60 MG/2ML IM SOLN
30.0000 mg | Freq: Once | INTRAMUSCULAR | Status: AC
  Administered 2018-11-25: 30 mg via INTRAMUSCULAR
  Filled 2018-11-25: qty 2
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
## 2018-11-25 NOTE — ED Notes (Signed)
Bed: WTR6 Expected date:  Expected time:  Means of arrival:  Comments: 

## 2018-11-25 NOTE — Discharge Instructions (Addendum)
Call you doctor to discuss pain management.
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
## 2018-11-27 ENCOUNTER — Encounter: Payer: Self-pay | Admitting: Family Medicine

## 2018-11-27 ENCOUNTER — Ambulatory Visit (INDEPENDENT_AMBULATORY_CARE_PROVIDER_SITE_OTHER): Payer: Self-pay | Admitting: Family Medicine

## 2018-11-27 VITALS — BP 126/74 | HR 91 | Resp 18 | Ht 71.0 in | Wt 191.8 lb

## 2018-11-27 DIAGNOSIS — F319 Bipolar disorder, unspecified: Secondary | ICD-10-CM

## 2018-11-27 DIAGNOSIS — F331 Major depressive disorder, recurrent, moderate: Secondary | ICD-10-CM

## 2018-11-27 DIAGNOSIS — S71132A Puncture wound without foreign body, left thigh, initial encounter: Secondary | ICD-10-CM

## 2018-11-27 DIAGNOSIS — M5410 Radiculopathy, site unspecified: Secondary | ICD-10-CM

## 2018-11-27 DIAGNOSIS — W3400XA Accidental discharge from unspecified firearms or gun, initial encounter: Secondary | ICD-10-CM

## 2018-11-27 DIAGNOSIS — Z7689 Persons encountering health services in other specified circumstances: Secondary | ICD-10-CM

## 2018-11-27 DIAGNOSIS — W3400XD Accidental discharge from unspecified firearms or gun, subsequent encounter: Secondary | ICD-10-CM

## 2018-11-27 DIAGNOSIS — S71132D Puncture wound without foreign body, left thigh, subsequent encounter: Secondary | ICD-10-CM

## 2018-11-27 DIAGNOSIS — D649 Anemia, unspecified: Secondary | ICD-10-CM

## 2018-11-27 MED ORDER — TRAZODONE HCL 100 MG PO TABS
100.0000 mg | ORAL_TABLET | Freq: Every evening | ORAL | 1 refills | Status: DC | PRN
Start: 2018-11-27 — End: 2018-12-30

## 2018-11-27 MED ORDER — KETOROLAC TROMETHAMINE 60 MG/2ML IM SOLN
60.0000 mg | Freq: Once | INTRAMUSCULAR | Status: AC
  Administered 2018-11-27: 60 mg via INTRAMUSCULAR

## 2018-11-27 MED ORDER — GABAPENTIN 300 MG PO CAPS: 600.0000 mg | ORAL_CAPSULE | Freq: Four times a day (QID) | ORAL | 1 refills | Status: DC

## 2018-11-27 MED ORDER — DULOXETINE HCL 30 MG PO CPEP: 30.0000 mg | ORAL_CAPSULE | Freq: Two times a day (BID) | ORAL | 1 refills | Status: DC

## 2018-11-27 MED ORDER — PREGABALIN 50 MG PO CAPS: 50.0000 mg | ORAL_CAPSULE | Freq: Three times a day (TID) | ORAL | 1 refills | Status: DC

## 2018-11-27 MED ORDER — METHOCARBAMOL 500 MG PO TABS: 500.0000 mg | ORAL_TABLET | Freq: Four times a day (QID) | ORAL | 1 refills | Status: DC | PRN

## 2018-11-27 MED ORDER — GABAPENTIN 300 MG PO CAPS: 300.0000 mg | ORAL_CAPSULE | Freq: Three times a day (TID) | ORAL | 0 refills | Status: DC

## 2018-11-27 MED FILL — traZODone HCL 100 MG TABS: 100 | 30 days supply | Qty: 60 | Fill #0

## 2018-11-27 MED FILL — GABAPENTIN 300 MG CAPSULE: 300 | 30 days supply | Qty: 240 | Fill #0

## 2018-11-27 MED FILL — DULoxetine HCL 30 MG CPEP: 30 | 30 days supply | Qty: 60 | Fill #0

## 2018-11-27 MED FILL — METHOCARBAMOL 500 MG TABS: 500 | 15 days supply | Qty: 120 | Fill #0
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
## 2018-11-27 NOTE — Patient Instructions (Addendum)
[facility] you for [facility]!    Jacob DolphinJonathan C Rogers was seen by Jacob CourtsKimberly Harris, FNP today.   Jacob RouseJonathan C Rogers's primary care provider is Jacob NeighborsHarris, Jacob S, FNP.   For the best care possible, you should try to see Jacob CourtsKimberly Harris, FNP-C whenever you come to the clinic.   We look forward to seeing you again soon!  If you have any questions about your visit today, please call us at [phone] or feel free to reach your primary care provider via MyChart.       Radicular Pain Radicular pain is a type of pain that spreads from your back or neck along a spinal nerve. Spinal nerves are nerves that leave the spinal cord and go to the muscles. Radicular pain is sometimes called radiculopathy, radiculitis, or a pinched nerve. When you have this type of pain, you may also have weakness, numbness, or tingling in the area of your body that is supplied by the nerve. The pain may feel sharp and burning. Depending on which spinal nerve is affected, the pain may occur in the:  Neck area (cervical radicular pain). You may also feel pain, numbness, weakness, or tingling in the arms.  Mid-spine area (thoracic radicular pain). You would feel this pain in the back and chest. This type is rare.  Lower back area (lumbar radicular pain). You would feel this pain as low back pain. You may feel pain, numbness, weakness, or tingling in the buttocks or legs. Sciatica is a type of lumbar radicular pain that shoots down the back of the leg. Radicular pain occurs when one of the spinal nerves becomes irritated or squeezed (compressed). It is often caused by something pushing on a spinal nerve, such as one of the bones of the spine (vertebrae) or one of the round cushions between vertebrae (intervertebral disks). This can result from:  An injury.  Wear and tear or aging of a disk.  The growth of a bone spur that pushes on the nerve. Radicular pain often goes away when  you follow instructions from your health care provider for relieving pain at home. Follow these instructions at home: Managing pain      If directed, put ice on the affected area: ? Put ice in a plastic bag. ? Place a towel between your skin and the bag. ? Leave the ice on for 20 minutes, 2-3 times a day.  If directed, apply heat to the affected area as often as told by your health care provider. Use the heat source that your health care provider recommends, such as a moist heat pack or a heating pad. ? Place a towel between your skin and the heat source. ? Leave the heat on for 20-30 minutes. ? Remove the heat if your skin turns bright red. This is especially important if you are unable to feel pain, heat, or cold. You may have a greater risk of getting burned. Activity   Do not sit or rest in bed for long periods of time.  Try to stay as active as possible. Ask your health care provider what type of exercise or activity is best for you.  Avoid activities that make your pain worse, such as bending and lifting.  Do not lift anything that is heavier than 10 lb (4.5 kg), or the limit that you are told, until your health care provider says that it is safe.  Practice using proper technique when lifting items. Proper  lifting technique involves bending your knees and rising up.  Do strength and range-of-motion exercises only as told by your health care provider or physical therapist. General instructions  Take over-the-counter and prescription medicines only as told by your health care provider.  Pay attention to any changes in your symptoms.  Keep all follow-up visits as told by your health care provider. This is important. ? Your health care provider may send you to a physical therapist to help with this pain. Contact a health care provider if:  Your pain and other symptoms get worse.  Your pain medicine is not helping.  Your pain has not improved after a few weeks of home  care.  You have a fever. Get help right away if:  You have severe pain, weakness, or numbness.  You have difficulty with bladder or bowel control. Summary  Radicular pain is a type of pain that spreads from your back or neck along a spinal nerve.  When you have radicular pain, you may also have weakness, numbness, or tingling in the area of your body that is supplied by the nerve.  The pain may feel sharp or burning.  Radicular pain may be treated with ice, heat, medicines, or physical therapy. This information is not intended to replace advice given to you by your health care provider. Make sure you discuss any questions you have with your health care provider. Document Released: 11/30/2004 Document Revised: 05/07/2018 Document Reviewed: 05/07/2018 Elsevier Interactive Patient Education  2019 [employer].

## 2018-11-28 LAB — CBC WITH DIFFERENTIAL/PLATELET
Basophils Absolute: 0.1 10*3/uL (ref 0.0–0.2)
Basos: 1 %
EOS (ABSOLUTE): 0.2 10*3/uL (ref 0.0–0.4)
Eos: 3 %
Hematocrit: 39.1 % (ref 37.5–51.0)
Hemoglobin: 12.6 g/dL — ABNORMAL LOW (ref 13.0–17.7)
IMMATURE GRANULOCYTES: 0 %
Immature Grans (Abs): 0 10*3/uL (ref 0.0–0.1)
Lymphocytes Absolute: 1.8 10*3/uL (ref 0.7–3.1)
Lymphs: 32 %
MCH: 28.3 pg (ref 26.6–33.0)
MCHC: 32.2 g/dL (ref 31.5–35.7)
MCV: 88 fL (ref 79–97)
MONOS ABS: 0.8 10*3/uL (ref 0.1–0.9)
Monocytes: 13 %
Neutrophils Absolute: 3 10*3/uL (ref 1.4–7.0)
Neutrophils: 51 %
Platelets: 443 10*3/uL (ref 150–450)
RBC: 4.46 x10E6/uL (ref 4.14–5.80)
RDW: 13.8 % (ref 11.6–15.4)
WBC: 5.8 10*3/uL (ref 3.4–10.8)

## 2018-11-28 LAB — COMPREHENSIVE METABOLIC PANEL
ALT: 14 IU/L (ref 0–44)
AST: 19 IU/L (ref 0–40)
Albumin/Globulin Ratio: 1.2 (ref 1.2–2.2)
Albumin: 4.3 g/dL (ref 4.1–5.2)
Alkaline Phosphatase: 134 IU/L — ABNORMAL HIGH (ref 39–117)
BILIRUBIN TOTAL: 0.7 mg/dL (ref 0.0–1.2)
BUN/Creatinine Ratio: 13 (ref 9–20)
BUN: 17 mg/dL (ref 6–20)
CHLORIDE: 102 mmol/L (ref 96–106)
CO2: 23 mmol/L (ref 20–29)
Calcium: 9.7 mg/dL (ref 8.7–10.2)
Creatinine, Ser: 1.26 mg/dL (ref 0.76–1.27)
GFR calc Af Amer: 88 mL/min/{1.73_m2} (ref >59)
GFR calc non Af Amer: 76 mL/min/{1.73_m2} (ref >59)
Globulin, Total: 3.7 g/dL (ref 1.5–4.5)
Glucose: 89 mg/dL (ref 65–99)
Potassium: 4.5 mmol/L (ref 3.5–5.2)
Sodium: 140 mmol/L (ref 134–144)
Total Protein: 8 g/dL (ref 6.0–8.5)

## 2018-11-28 LAB — IRON,TIBC AND FERRITIN PANEL
Ferritin: 356 ng/mL (ref 30–400)
Iron Saturation: 43 % (ref 15–55)
Iron: 108 ug/dL (ref 38–169)
Total Iron Binding Capacity: 252 ug/dL (ref 250–450)
UIBC: 144 ug/dL (ref 111–343)

## 2018-11-28 LAB — VITAMIN D 25 HYDROXY (VIT D DEFICIENCY, FRACTURES): Vit D, 25-Hydroxy: 26.6 ng/mL — ABNORMAL LOW (ref 30.0–100.0)

## 2018-12-03 ENCOUNTER — Ambulatory Visit (INDEPENDENT_AMBULATORY_CARE_PROVIDER_SITE_OTHER): Payer: Self-pay | Admitting: Licensed Clinical Social Worker

## 2018-12-03 DIAGNOSIS — F331 Major depressive disorder, recurrent, moderate: Secondary | ICD-10-CM

## 2018-12-03 DIAGNOSIS — F419 Anxiety disorder, unspecified: Secondary | ICD-10-CM

## 2018-12-03 DIAGNOSIS — F319 Bipolar disorder, unspecified: Secondary | ICD-10-CM

## 2018-12-03 MED ORDER — VITAMIN D (ERGOCALCIFEROL) 1.25 MG (50000 UNIT) PO CAPS: 50000.0000 [IU] | ORAL_CAPSULE | ORAL | 1 refills | Status: DC

## 2018-12-03 MED FILL — VIT D2 1.25 MG (50,000 UNIT: 1.25 MG | 28 days supply | Qty: 4 | Fill #0

## 2018-12-03 NOTE — Addendum Note (Signed)
Addended by: Bing NeighborsHARRIS, Delila Kuklinski S on: 12/03/2018 04:41 PM   Modules accepted: Orders

## 2018-12-04 ENCOUNTER — Other Ambulatory Visit: Payer: Self-pay

## 2018-12-04 ENCOUNTER — Ambulatory Visit: Payer: Self-pay | Attending: Family Medicine | Admitting: Physical Therapy

## 2018-12-04 ENCOUNTER — Encounter: Payer: Self-pay | Admitting: Physical Therapy

## 2018-12-04 DIAGNOSIS — R2689 Other abnormalities of gait and mobility: Secondary | ICD-10-CM

## 2018-12-04 DIAGNOSIS — M25672 Stiffness of left ankle, not elsewhere classified: Secondary | ICD-10-CM

## 2018-12-04 DIAGNOSIS — M79605 Pain in left leg: Secondary | ICD-10-CM

## 2018-12-04 DIAGNOSIS — M6281 Muscle weakness (generalized): Secondary | ICD-10-CM
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
## 2018-12-09 ENCOUNTER — Encounter: Payer: Self-pay | Admitting: Physical Therapy

## 2018-12-09 ENCOUNTER — Ambulatory Visit: Payer: Self-pay | Attending: Family Medicine | Admitting: Physical Therapy

## 2018-12-09 DIAGNOSIS — M6281 Muscle weakness (generalized): Secondary | ICD-10-CM | POA: Insufficient documentation

## 2018-12-09 DIAGNOSIS — M79605 Pain in left leg: Secondary | ICD-10-CM | POA: Insufficient documentation

## 2018-12-09 DIAGNOSIS — M25672 Stiffness of left ankle, not elsewhere classified: Secondary | ICD-10-CM | POA: Insufficient documentation

## 2018-12-09 DIAGNOSIS — R2689 Other abnormalities of gait and mobility: Secondary | ICD-10-CM | POA: Insufficient documentation

## 2018-12-09 NOTE — Patient Instructions (Addendum)
Stretching: Calf - Towel    Sit with knee straight and towel looped around left foot. Gently pull on towel until stretch is felt in calf. Hold _30___ seconds. Repeat __3_ times per set. Do __1__ sets per session. Do _1___ sessions per day.  http://orth.exer.us/706   Copyright  VHI. All rights reserved.
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
## 2018-12-11 ENCOUNTER — Ambulatory Visit: Payer: Self-pay | Admitting: Physical Therapy

## 2018-12-11 ENCOUNTER — Encounter: Payer: Self-pay | Admitting: Physical Therapy

## 2018-12-11 DIAGNOSIS — M6281 Muscle weakness (generalized): Secondary | ICD-10-CM

## 2018-12-11 DIAGNOSIS — M25672 Stiffness of left ankle, not elsewhere classified: Secondary | ICD-10-CM

## 2018-12-11 DIAGNOSIS — M79605 Pain in left leg: Secondary | ICD-10-CM

## 2018-12-11 DIAGNOSIS — R2689 Other abnormalities of gait and mobility: Secondary | ICD-10-CM
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
## 2018-12-13 ENCOUNTER — Encounter: Payer: Self-pay | Admitting: Physical Therapy

## 2018-12-16 ENCOUNTER — Ambulatory Visit: Payer: Self-pay | Admitting: Physical Therapy

## 2018-12-16 ENCOUNTER — Encounter: Payer: Self-pay | Admitting: Physical Therapy

## 2018-12-16 DIAGNOSIS — M25672 Stiffness of left ankle, not elsewhere classified: Secondary | ICD-10-CM

## 2018-12-16 DIAGNOSIS — R2689 Other abnormalities of gait and mobility: Secondary | ICD-10-CM

## 2018-12-16 DIAGNOSIS — M6281 Muscle weakness (generalized): Secondary | ICD-10-CM

## 2018-12-16 DIAGNOSIS — M79605 Pain in left leg: Secondary | ICD-10-CM
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
## 2018-12-18 ENCOUNTER — Ambulatory Visit: Payer: Self-pay | Admitting: Physical Therapy

## 2018-12-18 ENCOUNTER — Encounter: Payer: Self-pay | Admitting: Physical Therapy

## 2018-12-18 DIAGNOSIS — M79605 Pain in left leg: Secondary | ICD-10-CM

## 2018-12-18 DIAGNOSIS — R2689 Other abnormalities of gait and mobility: Secondary | ICD-10-CM

## 2018-12-18 DIAGNOSIS — M25672 Stiffness of left ankle, not elsewhere classified: Secondary | ICD-10-CM

## 2018-12-18 DIAGNOSIS — M6281 Muscle weakness (generalized): Secondary | ICD-10-CM
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
## 2018-12-20 ENCOUNTER — Encounter: Payer: Self-pay | Admitting: Physical Therapy

## 2018-12-20 ENCOUNTER — Ambulatory Visit: Payer: Self-pay | Admitting: Physical Therapy

## 2018-12-20 DIAGNOSIS — R2689 Other abnormalities of gait and mobility: Secondary | ICD-10-CM

## 2018-12-20 DIAGNOSIS — M6281 Muscle weakness (generalized): Secondary | ICD-10-CM

## 2018-12-20 DIAGNOSIS — M79605 Pain in left leg: Secondary | ICD-10-CM

## 2018-12-20 DIAGNOSIS — M25672 Stiffness of left ankle, not elsewhere classified: Secondary | ICD-10-CM
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
## 2018-12-23 ENCOUNTER — Ambulatory Visit: Payer: Self-pay | Admitting: Physical Therapy

## 2018-12-23 ENCOUNTER — Encounter: Payer: Self-pay | Admitting: Physical Therapy

## 2018-12-23 DIAGNOSIS — R2689 Other abnormalities of gait and mobility: Secondary | ICD-10-CM

## 2018-12-23 DIAGNOSIS — M79605 Pain in left leg: Secondary | ICD-10-CM

## 2018-12-23 DIAGNOSIS — M6281 Muscle weakness (generalized): Secondary | ICD-10-CM

## 2018-12-23 DIAGNOSIS — M25672 Stiffness of left ankle, not elsewhere classified: Secondary | ICD-10-CM
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
## 2018-12-25 ENCOUNTER — Encounter: Payer: Self-pay | Admitting: Physical Therapy

## 2018-12-25 ENCOUNTER — Ambulatory Visit: Payer: Self-pay | Admitting: Physical Therapy

## 2018-12-25 DIAGNOSIS — M25672 Stiffness of left ankle, not elsewhere classified: Secondary | ICD-10-CM

## 2018-12-25 DIAGNOSIS — R2689 Other abnormalities of gait and mobility: Secondary | ICD-10-CM

## 2018-12-25 DIAGNOSIS — M79605 Pain in left leg: Secondary | ICD-10-CM

## 2018-12-25 DIAGNOSIS — M6281 Muscle weakness (generalized): Secondary | ICD-10-CM
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
## 2018-12-27 ENCOUNTER — Ambulatory Visit: Payer: Self-pay | Admitting: Physical Therapy

## 2018-12-27 ENCOUNTER — Encounter: Payer: Self-pay | Admitting: Physical Therapy

## 2018-12-27 DIAGNOSIS — M6281 Muscle weakness (generalized): Secondary | ICD-10-CM

## 2018-12-27 DIAGNOSIS — M79605 Pain in left leg: Secondary | ICD-10-CM

## 2018-12-27 DIAGNOSIS — M25672 Stiffness of left ankle, not elsewhere classified: Secondary | ICD-10-CM

## 2018-12-27 DIAGNOSIS — R2689 Other abnormalities of gait and mobility: Secondary | ICD-10-CM
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
## 2018-12-30 ENCOUNTER — Ambulatory Visit: Payer: Self-pay | Admitting: Physical Therapy

## 2018-12-30 ENCOUNTER — Encounter: Payer: Self-pay | Admitting: Family Medicine

## 2018-12-30 ENCOUNTER — Ambulatory Visit (INDEPENDENT_AMBULATORY_CARE_PROVIDER_SITE_OTHER): Payer: Self-pay | Admitting: Family Medicine

## 2018-12-30 ENCOUNTER — Encounter: Payer: Self-pay | Admitting: Physical Therapy

## 2018-12-30 VITALS — BP 122/76 | HR 69 | Resp 17 | Ht 71.0 in | Wt 187.0 lb

## 2018-12-30 DIAGNOSIS — M79605 Pain in left leg: Secondary | ICD-10-CM

## 2018-12-30 DIAGNOSIS — M792 Neuralgia and neuritis, unspecified: Secondary | ICD-10-CM

## 2018-12-30 DIAGNOSIS — F331 Major depressive disorder, recurrent, moderate: Secondary | ICD-10-CM

## 2018-12-30 DIAGNOSIS — F319 Bipolar disorder, unspecified: Secondary | ICD-10-CM

## 2018-12-30 DIAGNOSIS — M6281 Muscle weakness (generalized): Secondary | ICD-10-CM

## 2018-12-30 DIAGNOSIS — M25672 Stiffness of left ankle, not elsewhere classified: Secondary | ICD-10-CM

## 2018-12-30 DIAGNOSIS — R2689 Other abnormalities of gait and mobility: Secondary | ICD-10-CM

## 2018-12-30 DIAGNOSIS — M5410 Radiculopathy, site unspecified: Secondary | ICD-10-CM

## 2018-12-30 MED ORDER — METHOCARBAMOL 500 MG PO TABS: 500.0000 mg | ORAL_TABLET | Freq: Four times a day (QID) | ORAL | 5 refills | Status: DC | PRN

## 2018-12-30 MED ORDER — GABAPENTIN 300 MG PO CAPS: 600.0000 mg | ORAL_CAPSULE | Freq: Four times a day (QID) | ORAL | 5 refills | Status: DC

## 2018-12-30 MED ORDER — DULOXETINE HCL 30 MG PO CPEP: 30.0000 mg | ORAL_CAPSULE | Freq: Two times a day (BID) | ORAL | 5 refills | Status: DC

## 2018-12-30 MED ORDER — TRAZODONE HCL 100 MG PO TABS: 100.0000 mg | ORAL_TABLET | Freq: Every evening | ORAL | 5 refills | Status: DC | PRN

## 2018-12-30 MED FILL — GABAPENTIN 300 MG CAPSULE: 300 | 30 days supply | Qty: 240 | Fill #0

## 2018-12-30 MED FILL — traZODone HCL 100 MG TABS: 100 | 30 days supply | Qty: 60 | Fill #0

## 2018-12-30 MED FILL — DULoxetine HCL 30 MG CPEP: 30 | 30 days supply | Qty: 60 | Fill #0

## 2018-12-30 MED FILL — METHOCARBAMOL 500 MG TABS: 500 | 15 days supply | Qty: 120 | Fill #0

## 2018-12-30 NOTE — Patient Instructions (Signed)
Neuropathic Pain Neuropathic pain is pain caused by damage to the nerves that are responsible for certain sensations in your body (sensory nerves). The pain can be caused by:  Damage to the sensory nerves that send signals to your spinal cord and brain (peripheral nervous system).  Damage to the sensory nerves in your brain or spinal cord (central nervous system). Neuropathic pain can make you more sensitive to pain. Even a minor sensation can feel very painful. This is usually a long-term condition that can be difficult to treat. The type of pain differs from person to person. It may:  Start suddenly (acute), or it may develop slowly and last for a long time (chronic).  Come and go as damaged nerves heal, or it may stay at the same level for years.  Cause emotional distress, loss of sleep, and a lower quality of life. What are the causes? The most common cause of this condition is diabetes. Many other diseases and conditions can also cause neuropathic pain. Causes of neuropathic pain can be classified as:  Toxic. This is caused by medicines and chemicals. The most common cause of toxic neuropathic pain is damage from cancer treatments (chemotherapy).  Metabolic. This can be caused by: ? Diabetes. This is the most common disease that damages the nerves. ? Lack of vitamin B from long-term alcohol abuse.  Traumatic. Any injury that cuts, crushes, or stretches a nerve can cause damage and pain. A common example is feeling pain after losing an arm or leg (phantom limb pain).  Compression-related. If a sensory nerve gets trapped or compressed for a long period of time, the blood supply to the nerve can be cut off.  Vascular. Many blood vessel diseases can cause neuropathic pain by decreasing blood supply and oxygen to nerves.  Autoimmune. This type of pain results from diseases in which the body's defense system (immune system) mistakenly attacks sensory nerves. Examples of autoimmune diseases  that can cause neuropathic pain include lupus and multiple sclerosis.  Infectious. Many types of viral infections can damage sensory nerves and cause pain. Shingles infection is a common cause of this type of pain.  Inherited. Neuropathic pain can be a symptom of many diseases that are passed down through families (genetic). What increases the risk? You are more likely to develop this condition if:  You have diabetes.  You smoke.  You drink too much alcohol.  You are taking certain medicines, including medicines that kill cancer cells (chemotherapy) or that treat immune system disorders. What are the signs or symptoms? The main symptom is pain. Neuropathic pain is often described as:  Burning.  Shock-like.  Stinging.  Hot or cold.  Itching. How is this diagnosed? No single test can diagnose neuropathic pain. It is diagnosed based on:  Physical exam and your symptoms. Your health care provider will ask you about your pain. You may be asked to use a pain scale to describe how bad your pain is.  Tests. These may be done to see if you have a high sensitivity to pain and to help find the cause and location of any sensory nerve damage. They include: ? Nerve conduction studies to test how well nerve signals travel through your sensory nerves (electrodiagnostic testing). ? Stimulating your sensory nerves through electrodes on your skin and measuring the response in your spinal cord and brain (somatosensory evoked potential).  Imaging studies, such as: ? X-rays. ? CT scan. ? MRI. How is this treated? Treatment for neuropathic pain may change   over time. You may need to try different treatment options or a combination of treatments. Some options include:  Treating the underlying cause of the neuropathy, such as diabetes, kidney disease, or vitamin deficiencies.  Stopping medicines that can cause neuropathy, such as chemotherapy.  Medicine to relieve pain. Medicines may include: ?  Prescription or over-the-counter pain medicine. ? Anti-seizure medicine. ? Antidepressant medicines. ? Pain-relieving patches that are applied to painful areas of skin. ? A medicine to numb the area (local anesthetic), which can be injected as a nerve block.  Transcutaneous nerve stimulation. This uses electrical currents to block painful nerve signals. The treatment is painless.  Alternative treatments, such as: ? Acupuncture. ? Meditation. ? Massage. ? Physical therapy. ? Pain management programs. ? Counseling. Follow these instructions at home: Medicines   Take over-the-counter and prescription medicines only as told by your health care provider.  Do not drive or use heavy machinery while taking prescription pain medicine.  If you are taking prescription pain medicine, take actions to prevent or treat constipation. Your health care provider may recommend that you: ? Drink enough fluid to keep your urine pale yellow. ? Eat foods that are high in fiber, such as fresh fruits and vegetables, whole grains, and beans. ? Limit foods that are high in fat and processed sugars, such as fried or sweet foods. ? Take an over-the-counter or prescription medicine for constipation. Lifestyle   Have a good support system at home.  Consider joining a chronic pain support group.  Do not use any products that contain nicotine or tobacco, such as cigarettes and e-cigarettes. If you need help quitting, ask your health care provider.  Do not drink alcohol. General instructions  Learn as much as you can about your condition.  Work closely with all your health care providers to find the treatment plan that works best for you.  Ask your health care provider what activities are safe for you.  Keep all follow-up visits as told by your health care provider. This is important. Contact a health care provider if:  Your pain treatments are not working.  You are having side effects from your  medicines.  You are struggling with tiredness (fatigue), mood changes, depression, or anxiety. Summary  Neuropathic pain is pain caused by damage to the nerves that are responsible for certain sensations in your body (sensory nerves).  Neuropathic pain may come and go as damaged nerves heal, or it may stay at the same level for years.  Neuropathic pain is usually a long-term condition that can be difficult to treat. Consider joining a chronic pain support group. This information is not intended to replace advice given to you by your health care provider. Make sure you discuss any questions you have with your health care provider. Document Released: 07/20/2004 Document Revised: 11/09/2017 Document Reviewed: 11/09/2017 Elsevier Interactive Patient Education  2019 Elsevier Inc.  
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
## 2019-01-01 ENCOUNTER — Encounter: Payer: Self-pay | Admitting: Physical Therapy

## 2019-01-01 ENCOUNTER — Ambulatory Visit: Payer: Self-pay | Admitting: Physical Therapy

## 2019-01-01 DIAGNOSIS — M25672 Stiffness of left ankle, not elsewhere classified: Secondary | ICD-10-CM

## 2019-01-01 DIAGNOSIS — M6281 Muscle weakness (generalized): Secondary | ICD-10-CM

## 2019-01-01 DIAGNOSIS — R2689 Other abnormalities of gait and mobility: Secondary | ICD-10-CM

## 2019-01-01 DIAGNOSIS — M79605 Pain in left leg: Secondary | ICD-10-CM
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
## 2019-01-02 DIAGNOSIS — M5410 Radiculopathy, site unspecified: Secondary | ICD-10-CM | POA: Insufficient documentation

## 2019-01-02 DIAGNOSIS — M792 Neuralgia and neuritis, unspecified: Secondary | ICD-10-CM | POA: Insufficient documentation

## 2019-01-03 ENCOUNTER — Encounter: Payer: Self-pay | Admitting: Physical Therapy

## 2019-01-03 ENCOUNTER — Ambulatory Visit: Payer: Self-pay | Admitting: Physical Therapy

## 2019-01-03 DIAGNOSIS — M79605 Pain in left leg: Secondary | ICD-10-CM

## 2019-01-03 DIAGNOSIS — M6281 Muscle weakness (generalized): Secondary | ICD-10-CM

## 2019-01-03 DIAGNOSIS — R2689 Other abnormalities of gait and mobility: Secondary | ICD-10-CM

## 2019-01-03 DIAGNOSIS — M25672 Stiffness of left ankle, not elsewhere classified: Secondary | ICD-10-CM
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
## 2019-01-06 ENCOUNTER — Encounter: Payer: Self-pay | Admitting: Physical Therapy

## 2019-01-06 ENCOUNTER — Ambulatory Visit: Payer: Self-pay | Attending: Family Medicine | Admitting: Physical Therapy

## 2019-01-06 DIAGNOSIS — M79605 Pain in left leg: Secondary | ICD-10-CM | POA: Insufficient documentation

## 2019-01-06 DIAGNOSIS — R2689 Other abnormalities of gait and mobility: Secondary | ICD-10-CM | POA: Insufficient documentation

## 2019-01-06 DIAGNOSIS — M6281 Muscle weakness (generalized): Secondary | ICD-10-CM | POA: Insufficient documentation

## 2019-01-06 DIAGNOSIS — M25672 Stiffness of left ankle, not elsewhere classified: Secondary | ICD-10-CM | POA: Insufficient documentation
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
## 2019-01-08 ENCOUNTER — Ambulatory Visit: Payer: Self-pay | Admitting: Physical Therapy

## 2019-01-08 ENCOUNTER — Encounter: Payer: Self-pay | Admitting: Physical Therapy

## 2019-01-08 DIAGNOSIS — M79605 Pain in left leg: Secondary | ICD-10-CM

## 2019-01-08 DIAGNOSIS — R2689 Other abnormalities of gait and mobility: Secondary | ICD-10-CM

## 2019-01-08 DIAGNOSIS — M25672 Stiffness of left ankle, not elsewhere classified: Secondary | ICD-10-CM

## 2019-01-08 DIAGNOSIS — M6281 Muscle weakness (generalized): Secondary | ICD-10-CM
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
## 2019-01-10 ENCOUNTER — Ambulatory Visit: Payer: Self-pay | Admitting: Physical Therapy

## 2019-01-10 ENCOUNTER — Encounter: Payer: Self-pay | Admitting: Physical Therapy

## 2019-01-10 DIAGNOSIS — M25672 Stiffness of left ankle, not elsewhere classified: Secondary | ICD-10-CM

## 2019-01-10 DIAGNOSIS — M79605 Pain in left leg: Secondary | ICD-10-CM

## 2019-01-10 DIAGNOSIS — R2689 Other abnormalities of gait and mobility: Secondary | ICD-10-CM

## 2019-01-10 DIAGNOSIS — M6281 Muscle weakness (generalized): Secondary | ICD-10-CM
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
## 2019-01-13 ENCOUNTER — Encounter: Payer: Self-pay | Admitting: Physical Therapy

## 2019-01-13 ENCOUNTER — Ambulatory Visit: Payer: Self-pay | Admitting: Physical Therapy

## 2019-01-13 DIAGNOSIS — R2689 Other abnormalities of gait and mobility: Secondary | ICD-10-CM

## 2019-01-13 DIAGNOSIS — M25672 Stiffness of left ankle, not elsewhere classified: Secondary | ICD-10-CM

## 2019-01-13 DIAGNOSIS — M6281 Muscle weakness (generalized): Secondary | ICD-10-CM

## 2019-01-13 DIAGNOSIS — M79605 Pain in left leg: Secondary | ICD-10-CM

## 2019-01-13 NOTE — Patient Instructions (Signed)
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
## 2019-01-15 ENCOUNTER — Encounter: Payer: Self-pay | Admitting: Physical Therapy

## 2019-01-15 ENCOUNTER — Ambulatory Visit: Payer: Self-pay | Admitting: Physical Therapy

## 2019-01-15 ENCOUNTER — Other Ambulatory Visit: Payer: Self-pay

## 2019-01-15 DIAGNOSIS — R2689 Other abnormalities of gait and mobility: Secondary | ICD-10-CM

## 2019-01-15 DIAGNOSIS — M25672 Stiffness of left ankle, not elsewhere classified: Secondary | ICD-10-CM

## 2019-01-15 DIAGNOSIS — M79605 Pain in left leg: Secondary | ICD-10-CM

## 2019-01-15 DIAGNOSIS — M6281 Muscle weakness (generalized): Secondary | ICD-10-CM
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
## 2019-01-17 ENCOUNTER — Ambulatory Visit: Payer: Self-pay | Admitting: Physical Therapy

## 2019-01-17 ENCOUNTER — Other Ambulatory Visit: Payer: Self-pay

## 2019-01-17 ENCOUNTER — Encounter: Payer: Self-pay | Admitting: Physical Therapy

## 2019-01-17 DIAGNOSIS — R2689 Other abnormalities of gait and mobility: Secondary | ICD-10-CM

## 2019-01-17 DIAGNOSIS — M79605 Pain in left leg: Secondary | ICD-10-CM

## 2019-01-17 DIAGNOSIS — M6281 Muscle weakness (generalized): Secondary | ICD-10-CM

## 2019-01-17 DIAGNOSIS — M25672 Stiffness of left ankle, not elsewhere classified: Secondary | ICD-10-CM
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
## 2019-01-20 ENCOUNTER — Ambulatory Visit: Payer: Self-pay | Admitting: Physical Therapy

## 2019-01-20 ENCOUNTER — Encounter: Payer: Self-pay | Admitting: Physical Therapy

## 2019-01-20 ENCOUNTER — Other Ambulatory Visit: Payer: Self-pay

## 2019-01-20 DIAGNOSIS — M79605 Pain in left leg: Secondary | ICD-10-CM

## 2019-01-20 DIAGNOSIS — M6281 Muscle weakness (generalized): Secondary | ICD-10-CM

## 2019-01-20 DIAGNOSIS — M25672 Stiffness of left ankle, not elsewhere classified: Secondary | ICD-10-CM

## 2019-01-20 DIAGNOSIS — R2689 Other abnormalities of gait and mobility: Secondary | ICD-10-CM
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
## 2019-01-22 ENCOUNTER — Ambulatory Visit: Payer: Self-pay | Admitting: Physical Therapy

## 2019-01-22 ENCOUNTER — Other Ambulatory Visit: Payer: Self-pay

## 2019-01-22 DIAGNOSIS — R2689 Other abnormalities of gait and mobility: Secondary | ICD-10-CM

## 2019-01-22 DIAGNOSIS — M25672 Stiffness of left ankle, not elsewhere classified: Secondary | ICD-10-CM

## 2019-01-22 DIAGNOSIS — M6281 Muscle weakness (generalized): Secondary | ICD-10-CM

## 2019-01-22 DIAGNOSIS — M79605 Pain in left leg: Secondary | ICD-10-CM
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
## 2019-01-24 ENCOUNTER — Ambulatory Visit: Payer: Self-pay | Admitting: Physical Therapy

## 2019-01-27 ENCOUNTER — Ambulatory Visit: Payer: Self-pay | Admitting: Physical Therapy

## 2019-01-29 ENCOUNTER — Ambulatory Visit: Payer: Self-pay

## 2019-02-03 ENCOUNTER — Ambulatory Visit: Payer: Self-pay | Admitting: Physical Therapy

## 2019-02-07 ENCOUNTER — Ambulatory Visit: Payer: Self-pay | Admitting: Physical Therapy

## 2019-02-10 ENCOUNTER — Other Ambulatory Visit: Payer: Self-pay

## 2019-02-10 ENCOUNTER — Ambulatory Visit: Payer: Self-pay | Attending: Family Medicine | Admitting: Physical Therapy

## 2019-02-10 ENCOUNTER — Encounter: Payer: Self-pay | Admitting: Physical Therapy

## 2019-02-10 DIAGNOSIS — M25672 Stiffness of left ankle, not elsewhere classified: Secondary | ICD-10-CM | POA: Insufficient documentation

## 2019-02-10 DIAGNOSIS — M6281 Muscle weakness (generalized): Secondary | ICD-10-CM | POA: Insufficient documentation

## 2019-02-10 DIAGNOSIS — R2689 Other abnormalities of gait and mobility: Secondary | ICD-10-CM | POA: Insufficient documentation

## 2019-02-10 DIAGNOSIS — M79605 Pain in left leg: Secondary | ICD-10-CM | POA: Insufficient documentation
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
## 2019-02-12 ENCOUNTER — Other Ambulatory Visit: Payer: Self-pay

## 2019-02-12 ENCOUNTER — Encounter: Payer: Self-pay | Admitting: Physical Therapy

## 2019-02-12 ENCOUNTER — Ambulatory Visit: Payer: Self-pay | Admitting: Physical Therapy

## 2019-02-12 DIAGNOSIS — R2689 Other abnormalities of gait and mobility: Secondary | ICD-10-CM

## 2019-02-12 DIAGNOSIS — M79605 Pain in left leg: Secondary | ICD-10-CM

## 2019-02-12 DIAGNOSIS — M25672 Stiffness of left ankle, not elsewhere classified: Secondary | ICD-10-CM

## 2019-02-12 DIAGNOSIS — M6281 Muscle weakness (generalized): Secondary | ICD-10-CM
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
## 2019-02-17 ENCOUNTER — Ambulatory Visit: Payer: Self-pay | Admitting: Physical Therapy

## 2019-02-17 ENCOUNTER — Other Ambulatory Visit: Payer: Self-pay

## 2019-02-17 ENCOUNTER — Encounter: Payer: Self-pay | Admitting: Physical Therapy

## 2019-02-17 DIAGNOSIS — M6281 Muscle weakness (generalized): Secondary | ICD-10-CM

## 2019-02-17 DIAGNOSIS — M79605 Pain in left leg: Secondary | ICD-10-CM

## 2019-02-17 DIAGNOSIS — M25672 Stiffness of left ankle, not elsewhere classified: Secondary | ICD-10-CM

## 2019-02-17 DIAGNOSIS — R2689 Other abnormalities of gait and mobility: Secondary | ICD-10-CM
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
## 2019-02-19 ENCOUNTER — Encounter: Payer: Self-pay | Admitting: Physical Therapy

## 2019-02-19 ENCOUNTER — Ambulatory Visit: Payer: Self-pay | Admitting: Physical Therapy

## 2019-02-19 ENCOUNTER — Other Ambulatory Visit: Payer: Self-pay

## 2019-02-19 DIAGNOSIS — M25672 Stiffness of left ankle, not elsewhere classified: Secondary | ICD-10-CM

## 2019-02-19 DIAGNOSIS — M6281 Muscle weakness (generalized): Secondary | ICD-10-CM

## 2019-02-19 DIAGNOSIS — M79605 Pain in left leg: Secondary | ICD-10-CM

## 2019-02-19 DIAGNOSIS — R2689 Other abnormalities of gait and mobility: Secondary | ICD-10-CM
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
## 2019-02-24 ENCOUNTER — Ambulatory Visit: Payer: Self-pay | Admitting: Physical Therapy

## 2019-02-24 ENCOUNTER — Other Ambulatory Visit: Payer: Self-pay

## 2019-02-24 ENCOUNTER — Encounter: Payer: Self-pay | Admitting: Physical Therapy

## 2019-02-24 DIAGNOSIS — M25672 Stiffness of left ankle, not elsewhere classified: Secondary | ICD-10-CM

## 2019-02-24 DIAGNOSIS — R2689 Other abnormalities of gait and mobility: Secondary | ICD-10-CM

## 2019-02-24 DIAGNOSIS — M6281 Muscle weakness (generalized): Secondary | ICD-10-CM

## 2019-02-24 DIAGNOSIS — M79605 Pain in left leg: Secondary | ICD-10-CM
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
## 2019-02-26 ENCOUNTER — Other Ambulatory Visit: Payer: Self-pay

## 2019-02-26 ENCOUNTER — Ambulatory Visit: Payer: Self-pay | Admitting: Physical Therapy

## 2019-02-26 DIAGNOSIS — M79605 Pain in left leg: Secondary | ICD-10-CM

## 2019-02-26 DIAGNOSIS — R2689 Other abnormalities of gait and mobility: Secondary | ICD-10-CM

## 2019-02-26 DIAGNOSIS — M6281 Muscle weakness (generalized): Secondary | ICD-10-CM

## 2019-02-26 DIAGNOSIS — M25672 Stiffness of left ankle, not elsewhere classified: Secondary | ICD-10-CM

## 2019-02-27 ENCOUNTER — Encounter: Payer: Self-pay | Admitting: Physical Therapy
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
## 2019-03-04 ENCOUNTER — Ambulatory Visit: Payer: Self-pay | Admitting: Physical Therapy

## 2019-03-04 ENCOUNTER — Other Ambulatory Visit: Payer: Self-pay

## 2019-03-04 ENCOUNTER — Encounter: Payer: Self-pay | Admitting: Physical Therapy

## 2019-03-04 DIAGNOSIS — M25672 Stiffness of left ankle, not elsewhere classified: Secondary | ICD-10-CM

## 2019-03-04 DIAGNOSIS — R2689 Other abnormalities of gait and mobility: Secondary | ICD-10-CM

## 2019-03-04 DIAGNOSIS — M79605 Pain in left leg: Secondary | ICD-10-CM

## 2019-03-04 DIAGNOSIS — M6281 Muscle weakness (generalized): Secondary | ICD-10-CM
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
## 2019-03-06 ENCOUNTER — Ambulatory Visit: Payer: Self-pay | Admitting: Physical Therapy

## 2019-03-06 ENCOUNTER — Other Ambulatory Visit: Payer: Self-pay

## 2019-03-06 ENCOUNTER — Encounter: Payer: Self-pay | Admitting: Physical Therapy

## 2019-03-06 DIAGNOSIS — M79605 Pain in left leg: Secondary | ICD-10-CM

## 2019-03-06 DIAGNOSIS — M6281 Muscle weakness (generalized): Secondary | ICD-10-CM

## 2019-03-06 DIAGNOSIS — M25672 Stiffness of left ankle, not elsewhere classified: Secondary | ICD-10-CM

## 2019-03-06 DIAGNOSIS — R2689 Other abnormalities of gait and mobility: Secondary | ICD-10-CM
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
## 2019-03-10 ENCOUNTER — Ambulatory Visit: Payer: Self-pay | Attending: Family Medicine | Admitting: Physical Therapy

## 2019-03-10 ENCOUNTER — Encounter: Payer: Self-pay | Admitting: Physical Therapy

## 2019-03-10 ENCOUNTER — Other Ambulatory Visit: Payer: Self-pay

## 2019-03-10 DIAGNOSIS — M25672 Stiffness of left ankle, not elsewhere classified: Secondary | ICD-10-CM | POA: Insufficient documentation

## 2019-03-10 DIAGNOSIS — M6281 Muscle weakness (generalized): Secondary | ICD-10-CM | POA: Insufficient documentation

## 2019-03-10 DIAGNOSIS — M79605 Pain in left leg: Secondary | ICD-10-CM | POA: Insufficient documentation

## 2019-03-10 DIAGNOSIS — R2689 Other abnormalities of gait and mobility: Secondary | ICD-10-CM | POA: Insufficient documentation
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
## 2019-03-12 ENCOUNTER — Encounter: Payer: Self-pay | Admitting: Physical Therapy

## 2019-03-12 ENCOUNTER — Ambulatory Visit: Payer: Self-pay | Admitting: Physical Therapy

## 2019-03-12 ENCOUNTER — Other Ambulatory Visit: Payer: Self-pay

## 2019-03-12 DIAGNOSIS — M79605 Pain in left leg: Secondary | ICD-10-CM

## 2019-03-12 DIAGNOSIS — R2689 Other abnormalities of gait and mobility: Secondary | ICD-10-CM

## 2019-03-12 DIAGNOSIS — M25672 Stiffness of left ankle, not elsewhere classified: Secondary | ICD-10-CM

## 2019-03-12 DIAGNOSIS — M6281 Muscle weakness (generalized): Secondary | ICD-10-CM
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
## 2019-03-20 ENCOUNTER — Ambulatory Visit: Payer: Medicaid Other | Admitting: Physical Therapy

## 2019-03-20 ENCOUNTER — Encounter: Payer: Self-pay | Admitting: Physical Therapy

## 2019-03-20 ENCOUNTER — Other Ambulatory Visit: Payer: Self-pay

## 2019-03-20 DIAGNOSIS — R2689 Other abnormalities of gait and mobility: Secondary | ICD-10-CM

## 2019-03-20 DIAGNOSIS — M25672 Stiffness of left ankle, not elsewhere classified: Secondary | ICD-10-CM

## 2019-03-20 DIAGNOSIS — M6281 Muscle weakness (generalized): Secondary | ICD-10-CM

## 2019-03-20 DIAGNOSIS — M79605 Pain in left leg: Secondary | ICD-10-CM
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
## 2019-03-26 ENCOUNTER — Ambulatory Visit: Payer: Medicaid Other | Admitting: Physical Therapy

## 2019-03-26 ENCOUNTER — Other Ambulatory Visit: Payer: Self-pay

## 2019-03-26 ENCOUNTER — Encounter: Payer: Self-pay | Admitting: Physical Therapy

## 2019-03-26 DIAGNOSIS — R2689 Other abnormalities of gait and mobility: Secondary | ICD-10-CM

## 2019-03-26 DIAGNOSIS — M25672 Stiffness of left ankle, not elsewhere classified: Secondary | ICD-10-CM

## 2019-03-26 DIAGNOSIS — M79605 Pain in left leg: Secondary | ICD-10-CM

## 2019-03-26 DIAGNOSIS — M6281 Muscle weakness (generalized): Secondary | ICD-10-CM
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
## 2019-04-02 ENCOUNTER — Other Ambulatory Visit: Payer: Self-pay

## 2019-04-02 ENCOUNTER — Ambulatory Visit: Payer: Medicaid Other | Admitting: Physical Therapy

## 2019-04-02 ENCOUNTER — Encounter: Payer: Self-pay | Admitting: Physical Therapy

## 2019-04-02 DIAGNOSIS — M6281 Muscle weakness (generalized): Secondary | ICD-10-CM

## 2019-04-02 DIAGNOSIS — M25672 Stiffness of left ankle, not elsewhere classified: Secondary | ICD-10-CM

## 2019-04-02 DIAGNOSIS — M79605 Pain in left leg: Secondary | ICD-10-CM

## 2019-04-02 DIAGNOSIS — R2689 Other abnormalities of gait and mobility: Secondary | ICD-10-CM
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
## 2019-04-29 ENCOUNTER — Other Ambulatory Visit: Payer: Self-pay

## 2019-04-29 MED ORDER — METHOCARBAMOL 500 MG PO TABS: 500.0000 mg | ORAL_TABLET | Freq: Four times a day (QID) | ORAL | 5 refills | Status: DC | PRN

## 2019-04-29 MED ORDER — GABAPENTIN 300 MG PO CAPS: 600.0000 mg | ORAL_CAPSULE | Freq: Four times a day (QID) | ORAL | 5 refills | Status: DC

## 2019-04-29 NOTE — Telephone Encounter (Signed)
Patient states that he has switched to [place] [facility] [address]. Needs refills sent to pharmacy.

## 2019-06-16 MED FILL — GABAPENTIN 300 MG CAPSULE: 300 | 30 days supply | Qty: 240 | Fill #1

## 2019-06-27 ENCOUNTER — Telehealth: Payer: Self-pay

## 2019-06-27 NOTE — Telephone Encounter (Signed)
Called patient to do their pre-visit COVID screening.  Have you been tested positive for COVID or are you currently waiting for COVID test results? no  Have you recently traveled internationally([place], [place], [place], [place], [place]) or within the [place] to a hotspot area([place], [place], [place], [place], [place])? no  Are you currently experiencing any of the following symptoms: fever, cough, SHOB, fatigue, body aches, loss of smell, rash, diarrhea, vomiting, severe headaches, weakness, sore throat? no  Have you been in contact with anyone who has recently travelled? no  Have you been in contact with anyone who is experiencing any of the above symptoms or been diagnosed with COVID  or works in or has recently visited a SNF? no  

## 2019-06-30 ENCOUNTER — Encounter: Payer: Self-pay | Admitting: Family Medicine

## 2019-06-30 ENCOUNTER — Other Ambulatory Visit: Payer: Self-pay

## 2019-06-30 ENCOUNTER — Ambulatory Visit (INDEPENDENT_AMBULATORY_CARE_PROVIDER_SITE_OTHER): Payer: Self-pay | Admitting: Family Medicine

## 2019-06-30 VITALS — BP 115/74 | HR 79 | Temp 97.5°F | Resp 17 | Ht 71.0 in | Wt 184.0 lb

## 2019-06-30 DIAGNOSIS — M792 Neuralgia and neuritis, unspecified: Secondary | ICD-10-CM

## 2019-06-30 DIAGNOSIS — F329 Major depressive disorder, single episode, unspecified: Secondary | ICD-10-CM

## 2019-06-30 DIAGNOSIS — Z87828 Personal history of other (healed) physical injury and trauma: Secondary | ICD-10-CM

## 2019-06-30 DIAGNOSIS — E559 Vitamin D deficiency, unspecified: Secondary | ICD-10-CM

## 2019-06-30 DIAGNOSIS — D649 Anemia, unspecified: Secondary | ICD-10-CM

## 2019-06-30 MED ORDER — VITAMIN D (ERGOCALCIFEROL) 1.25 MG (50000 UNIT) PO CAPS: 50000.0000 [IU] | ORAL_CAPSULE | ORAL | 1 refills | Status: DC

## 2019-06-30 MED ORDER — DULOXETINE HCL 30 MG PO CPEP: 30.0000 mg | ORAL_CAPSULE | Freq: Two times a day (BID) | ORAL | 5 refills | Status: DC

## 2019-06-30 MED FILL — DULoxetine HCL 30 MG CPEP: 30 | 30 days supply | Qty: 60 | Fill #0

## 2019-06-30 MED FILL — VIT D2 1.25 MG (50,000 UNIT: 1.25 MG | 35 days supply | Qty: 5 | Fill #0

## 2019-06-30 NOTE — Patient Instructions (Signed)
Neuropathic Pain Neuropathic pain is pain caused by damage to the nerves that are responsible for certain sensations in your body (sensory nerves). The pain can be caused by:  Damage to the sensory nerves that send signals to your spinal cord and brain (peripheral nervous system).  Damage to the sensory nerves in your brain or spinal cord (central nervous system). Neuropathic pain can make you more sensitive to pain. Even a minor sensation can feel very painful. This is usually a long-term condition that can be difficult to treat. The type of pain differs from person to person. It may:  Start suddenly (acute), or it may develop slowly and last for a long time (chronic).  Come and go as damaged nerves heal, or it may stay at the same level for years.  Cause emotional distress, loss of sleep, and a lower quality of life. What are the causes? The most common cause of this condition is diabetes. Many other diseases and conditions can also cause neuropathic pain. Causes of neuropathic pain can be classified as:  Toxic. This is caused by medicines and chemicals. The most common cause of toxic neuropathic pain is damage from cancer treatments (chemotherapy).  Metabolic. This can be caused by: ? Diabetes. This is the most common disease that damages the nerves. ? Lack of vitamin B from long-term alcohol abuse.  Traumatic. Any injury that cuts, crushes, or stretches a nerve can cause damage and pain. A common example is feeling pain after losing an arm or leg (phantom limb pain).  Compression-related. If a sensory nerve gets trapped or compressed for a long period of time, the blood supply to the nerve can be cut off.  Vascular. Many blood vessel diseases can cause neuropathic pain by decreasing blood supply and oxygen to nerves.  Autoimmune. This type of pain results from diseases in which the body's defense system (immune system) mistakenly attacks sensory nerves. Examples of autoimmune diseases  that can cause neuropathic pain include lupus and multiple sclerosis.  Infectious. Many types of viral infections can damage sensory nerves and cause pain. Shingles infection is a common cause of this type of pain.  Inherited. Neuropathic pain can be a symptom of many diseases that are passed down through families (genetic). What increases the risk? You are more likely to develop this condition if:  You have diabetes.  You smoke.  You drink too much alcohol.  You are taking certain medicines, including medicines that kill cancer cells (chemotherapy) or that treat immune system disorders. What are the signs or symptoms? The main symptom is pain. Neuropathic pain is often described as:  Burning.  Shock-like.  Stinging.  Hot or cold.  Itching. How is this diagnosed? No single test can diagnose neuropathic pain. It is diagnosed based on:  Physical exam and your symptoms. Your health care provider will ask you about your pain. You may be asked to use a pain scale to describe how bad your pain is.  Tests. These may be done to see if you have a high sensitivity to pain and to help find the cause and location of any sensory nerve damage. They include: ? Nerve conduction studies to test how well nerve signals travel through your sensory nerves (electrodiagnostic testing). ? Stimulating your sensory nerves through electrodes on your skin and measuring the response in your spinal cord and brain (somatosensory evoked potential).  Imaging studies, such as: ? X-rays. ? CT scan. ? MRI. How is this treated? Treatment for neuropathic pain may change   over time. You may need to try different treatment options or a combination of treatments. Some options include:  Treating the underlying cause of the neuropathy, such as diabetes, kidney disease, or vitamin deficiencies.  Stopping medicines that can cause neuropathy, such as chemotherapy.  Medicine to relieve pain. Medicines may  include: ? Prescription or over-the-counter pain medicine. ? Anti-seizure medicine. ? Antidepressant medicines. ? Pain-relieving patches that are applied to painful areas of skin. ? A medicine to numb the area (local anesthetic), which can be injected as a nerve block.  Transcutaneous nerve stimulation. This uses electrical currents to block painful nerve signals. The treatment is painless.  Alternative treatments, such as: ? Acupuncture. ? Meditation. ? Massage. ? Physical therapy. ? Pain management programs. ? Counseling. Follow these instructions at home: Medicines   Take over-the-counter and prescription medicines only as told by your health care provider.  Do not drive or use heavy machinery while taking prescription pain medicine.  If you are taking prescription pain medicine, take actions to prevent or treat constipation. Your health care provider may recommend that you: ? Drink enough fluid to keep your urine pale yellow. ? Eat foods that are high in fiber, such as fresh fruits and vegetables, whole grains, and beans. ? Limit foods that are high in fat and processed sugars, such as fried or sweet foods. ? Take an over-the-counter or prescription medicine for constipation. Lifestyle   Have a good support system at home.  Consider joining a chronic pain support group.  Do not use any products that contain nicotine or tobacco, such as cigarettes and e-cigarettes. If you need help quitting, ask your health care provider.  Do not drink alcohol. General instructions  Learn as much as you can about your condition.  Work closely with all your health care providers to find the treatment plan that works best for you.  Ask your health care provider what activities are safe for you.  Keep all follow-up visits as told by your health care provider. This is important. Contact a health care provider if:  Your pain treatments are not working.  You are having side effects  from your medicines.  You are struggling with tiredness (fatigue), mood changes, depression, or anxiety. Summary  Neuropathic pain is pain caused by damage to the nerves that are responsible for certain sensations in your body (sensory nerves).  Neuropathic pain may come and go as damaged nerves heal, or it may stay at the same level for years.  Neuropathic pain is usually a long-term condition that can be difficult to treat. Consider joining a chronic pain support group. This information is not intended to replace advice given to you by your health care provider. Make sure you discuss any questions you have with your health care provider. Document Released: 07/20/2004 Document Revised: 02/13/2019 Document Reviewed: 11/09/2017 Else[employer] Patient Education  2020 [employer]  Managing Post-Traumatic Stress Disorder If you have been diagnosed with post-traumatic stress disorder (PTSD), you may be relieved that you now know why you have felt or behaved a certain way. Still, you may feel overwhelmed about the treatment ahead. You may also wonder how to get the support you need and how to deal with the condition day-to-day. If you are living with PTSD, there are ways to help you recover from it and manage your symptoms. How to manage lifestyle changes Managing stress Stress is your body's reaction to life changes and events, both good and bad. Stress can make PTSD worse. Take the  following steps to manage stress:  Talk with your health care provider or a counselor if you would like to learn more about techniques to reduce your stress. He or she may suggest some stress reduction techniques such as: ? Muscle relaxation exercises. ? Regular exercise. ? Meditation, yoga, or other mind-body exercises. ? Breathing exercises. ? Listening to quiet music. ? Spending time outside.  Maintain a healthy lifestyle. Eat a healthy diet, exercise regularly, get plenty of sleep, and take time to  relax.  Spend time with others. Talk with them about how you are feeling and what kind of support you need. Try not to isolate yourself, even though you may feel like doing that. Isolating yourself can delay your recovery.  Do activities and hobbies that you enjoy.  Pace yourself when doing stressful things. Take breaks, and reward yourself when you finish. Make sure that you do not overload your schedule.  Medicines Your health care provider may suggest certain medicines if he or she feels that they will help to improve your condition. Medicines for depression (antidepressants) or severe loss of contact with reality (antipsychotics) may be used to treat PTSD. Avoid using alcohol and other substances that may prevent your medicines from working properly. It is also important to:  Talk with your pharmacist or health care provider about all medicines that you take, their possible side effects, and which medicines are safe to take together.  Make it your goal to take part in all treatment decisions (shared decision-making). Ask about possible side effects of medicines that your health care provider recommends, and tell him or her how you feel about having those side effects. It is best if shared decision-making with your health care provider is part of your total treatment plan. If your health care provider prescribes a medicine, you may not notice the full benefits of it for 4-8 weeks. Most people who are treated for PTSD need to take medicine for at least 6-12 months after they feel better. If you are taking medicines as part of your treatment, do not stop taking medicines before you ask your health care provider if it is safe to stop. You may need to have the medicine slowly decreased (tapered) over time to lower the risk of harmful side effects. Relationships Many people who have PTSD have difficulty trusting others. Make an effort to:  Take risks and develop trust with close friends and family  members. Developing trust in others can help you feel safe and connect you with emotional support.  Be open and honest about your feelings.  Have fun and relax in safe spaces, such as with friends and family.  Think about going to couples counseling, family education classes, or family therapy. Your loved ones may not always know how to be supportive. Therapy can be helpful for everyone. How to recognize changes in your condition Be aware of your symptoms and how often you have them. The following symptoms mean that you need to seek help for your PTSD:  You feel suspicious and angry.  You have repeated flashbacks.  You avoid going out or being with others.  You have an increasing number of fights with close friends or family members, such as your spouse.  You have thoughts about hurting yourself or others.  You cannot get relief from feelings of depression or anxiety. Follow these instructions at home: Lifestyle  Exercise regularly. Try to do 30 or more minutes of physical activity on most days of the week.  Try  to get 7-9 hours of sleep each night. To help with sleep: ? Keep your bedroom cool and dark. ? Avoid screen time before bedtime. This means avoiding use of your TV, computer, tablet, and cell phone.  Practice self-soothing skills and use them daily.  Try to have fun and seek humor in your life. Eating and drinking  Do not eat a heavy meal during the hour before you go to bed.  Do not drink alcohol or caffeinated drinks before bed.  Avoid using alcohol or drugs. General instructions  If your PTSD is affecting your marriage or family, seek help from a family therapist.  Take over-the-counter and prescription medicines only as told by your health care provider.  Make sure to let all of your health care providers know that you have PTSD. This is especially important if you are having surgery or need to be admitted to the hospital.  Keep all follow-up visits as told  by your health care providers. This is important. Where to find support Talking to others  Explain that PTSD is a mental health problem. It is something that a person can develop after experiencing or seeing a life-threatening event. Tell them that PTSD makes you feel stress like you did during the event.  Talk to your loved ones about the symptoms you have. Also tell them what things or situations can cause symptoms to start (are triggers for you).  Assure your loved ones that there are treatments to help PTSD. Discuss possibly seeking family therapy or couples therapy.  If you are worried or fearful about seeking treatment, ask for support.  Keep daily contact with at least one trusted friend or family member. Finances Not all insurance plans cover mental health care, so it is important to check with your insurance carrier. If paying for co-pays or counseling services is a problem, search for a local or county mental health care center. Public mental health care services may be offered there at a low cost or no cost when you are not able to see a private health care provider. If you are a veteran, contact a local veterans organization or veterans hospital for more information. If you are taking medicine for PTSD, you may be able to get the genericform, which may be less expensive than brand-name medicine. Some makers of prescription medicines also offer help to patients who cannot afford the medicines that they need. Therapy and support groups  Find a support group in your community. Often, groups are available for [employer] and [employer] veterans, trauma victims, and family members or caregivers.  Look into volunteer opportunities. Taking part in these can help you feel more connected to your community.  Contact a local organization to find out if you are eligible for a service dog. Where to find more information Go to this website to find more information about PTSD, treatment of PTSD, and how to get  support:  [facility]: www.ptsd.[place].tnva.gov Contact a health care provider if:  Your symptoms get worse or do not get better. Get help right away if:  You have thoughts about hurting yourself or others. If you ever feel like you may hurt yourself or others, or have thoughts about taking your own life, get help right away. You can go to your nearest emergency department or call:  Your local emergency services (911 in the [place]  A suicide crisis helpline, such as the National Suicide Prevention Lifeline at 587-018-77681-618-537-7892. This is open 24-hours a day. Summary  If you are  living with PTSD, there are ways to help you recover from it and manage your symptoms.  Find supportive environments and people who understand PTSD. Spend time in those places, and maintain contact with those people.  Work with your health care team to create a plan for managing PTSD. The plan should include counseling, stress reduction techniques, and healthy lifestyle habits. This information is not intended to replace advice given to you by your health care provider. Make sure you discuss any questions you have with your health care provider. Document Released: 02/22/2017 Document Revised: 02/14/2019 Document Reviewed: 02/22/2017 [employer] Patient Education  2020 [employer]
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
## 2019-07-25 ENCOUNTER — Telehealth: Payer: Self-pay | Admitting: Licensed Clinical Social Worker

## 2019-07-25 NOTE — Telephone Encounter (Signed)
Call placed to patient. LCSW informed pt of referral to address his recent medicaid denial. Pt shared that he received a call from someone approximately two days ago to appeal denial.   LCSW discussed pt's options to apply for Financial Assistance for [facility] and/or referral to Legal Aid to assist with appeal process.   Pt provided verbal consent for LCSW to complete referral to Legal Aid. LCSW encouraged pt to contact her with any additional questions or concerns that may arise. Pt verbalized understanding. No additional concerns noted.

## 2019-07-25 NOTE — Telephone Encounter (Signed)
LCSW completed Legal Aid referral for assistance with medicaid appeal.

## 2019-09-25 MED FILL — GABAPENTIN 300 MG CAPSULE: 300 | 30 days supply | Qty: 240 | Fill #2

## 2019-09-30 ENCOUNTER — Ambulatory Visit (INDEPENDENT_AMBULATORY_CARE_PROVIDER_SITE_OTHER): Payer: Self-pay | Admitting: Internal Medicine

## 2019-09-30 ENCOUNTER — Other Ambulatory Visit: Payer: Self-pay

## 2019-09-30 DIAGNOSIS — F329 Major depressive disorder, single episode, unspecified: Secondary | ICD-10-CM

## 2019-09-30 DIAGNOSIS — M792 Neuralgia and neuritis, unspecified: Secondary | ICD-10-CM

## 2019-09-30 DIAGNOSIS — Z2821 Immunization not carried out because of patient refusal: Secondary | ICD-10-CM
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
## 2019-09-30 NOTE — Progress Notes (Signed)
States that he stopped taking the Cymbalta due to it worsening his depression.  Gabapentin has been helping with the nerve pain. Taking OTC Tylenol to help with pain. Is trying to stretch & exercise.  \

## 2019-12-01 MED FILL — GABAPENTIN 300 MG CAPSULE: 300 | 30 days supply | Qty: 240 | Fill #3

## 2019-12-01 MED FILL — VIT D2 1.25 MG (50,000 UNIT: 1.25 MG | 35 days supply | Qty: 5 | Fill #0

## 2019-12-03 ENCOUNTER — Telehealth: Payer: Self-pay

## 2019-12-03 NOTE — Telephone Encounter (Signed)
Called patient to do their pre-visit COVID screening.  Patient states that he will be at work & needs to have the appointment virtually.

## 2019-12-04 ENCOUNTER — Other Ambulatory Visit: Payer: Self-pay

## 2019-12-04 ENCOUNTER — Encounter: Payer: Self-pay | Admitting: Internal Medicine

## 2019-12-04 ENCOUNTER — Ambulatory Visit (INDEPENDENT_AMBULATORY_CARE_PROVIDER_SITE_OTHER): Payer: Self-pay | Admitting: Internal Medicine

## 2019-12-04 VITALS — BP 125/72 | HR 60 | Temp 97.3°F | Resp 17 | Wt 181.0 lb

## 2019-12-04 DIAGNOSIS — M5410 Radiculopathy, site unspecified: Secondary | ICD-10-CM

## 2019-12-04 DIAGNOSIS — M792 Neuralgia and neuritis, unspecified: Secondary | ICD-10-CM
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
## 2019-12-10 DIAGNOSIS — M25511 Pain in right shoulder: Secondary | ICD-10-CM

## 2019-12-22 ENCOUNTER — Other Ambulatory Visit: Payer: Self-pay

## 2019-12-22 ENCOUNTER — Ambulatory Visit (HOSPITAL_COMMUNITY)
Admission: EM | Admit: 2019-12-22 | Discharge: 2019-12-22 | Disposition: A | Discharge status: Home / Self Care | Payer: Medicaid Other

## 2019-12-22 DIAGNOSIS — S46911A Strain of unspecified muscle, fascia and tendon at shoulder and upper arm level, right arm, initial encounter: Secondary | ICD-10-CM

## 2019-12-22 DIAGNOSIS — M542 Cervicalgia: Secondary | ICD-10-CM

## 2019-12-22 MED ORDER — IBUPROFEN 800 MG PO TABS: 800.0000 mg | ORAL_TABLET | Freq: Three times a day (TID) | ORAL | 0 refills | Status: DC | PRN

## 2019-12-22 MED ORDER — CYCLOBENZAPRINE HCL 10 MG PO TABS: 10.0000 mg | ORAL_TABLET | Freq: Two times a day (BID) | ORAL | 0 refills | Status: DC | PRN

## 2019-12-22 MED FILL — CYCLOBENZAPRINE 10 MG TAB: 10 | 10 days supply | Qty: 20 | Fill #0

## 2019-12-22 MED FILL — IBUPROFEN 800 MG TABLET: 800 | 7 days supply | Qty: 21 | Fill #0

## 2019-12-22 NOTE — Discharge Instructions (Addendum)
Take the prescribed ibuprofen as needed for your pain.  Take the muscle relaxer Flexeril as needed for muscle spasm; do not drive, operate machinery, or drink alcohol with this medication as it may make you drowsy.    Follow up with an orthopedist or your primary care provider if your pain is not improving.  Go to the emergency department if you have worsening pain or develop new symptoms such as difficulty with urination, weakness, numbness, loss of control of your bladder or bowels, fever, chills or other concerns.   

## 2019-12-22 NOTE — ED Triage Notes (Signed)
Pt c/o right neck, scapular muscle pain for approx 1 week. States difficulty sleeping. Had similar sx when performing shoulder shrugs for physical exercise. Had massage performed w/o improvement.

## 2019-12-22 NOTE — ED Provider Notes (Signed)
[facility]   [id number] 12/22/19 Arrival Time: 2725  DG:UYQIH PAIN  SUBJECTIVE: History from: patient. Jacob Rogers is a 34 y.o. male complains of right neck and shoulder pain that began 1 week ago.  Denies a precipitating event or specific injury.  Localizes the pain to the R neck/back area. Describes the pain as intermittent and achy in character. Has tried OTC medications without relief.  Symptoms are made worse with increased activity.  Reports similar symptoms in the past.  Denies fever, chills, erythema, ecchymosis, effusion, weakness, numbness and tingling, saddle paresthesias, loss of bowel or bladder function.      ROS: As per HPI.  All other pertinent ROS negative.     Past Medical History:  Diagnosis Date  . Depression   . Paranoid schizophrenia [facility])    Past Surgical History:  Procedure Laterality Date  . FRACTURE SURGERY     right foot  . I & D EXTREMITY Left 11/03/2018   Procedure: IRRIGATION AND DEBRIDEMENT, OPEN FRACTURE;  Surgeon: Terance Hart, MD;  Location: [facility] OR;  Service: Orthopedics;  Laterality: Left;  . INTRAMEDULLARY (IM) NAIL INTERTROCHANTERIC Left 11/03/2018   Procedure: INTRAMEDULLARY (IM) NAIL, LEFT FEMUR;  Surgeon: Terance Hart, MD;  Location: [facility] OR;  Service: Orthopedics;  Laterality: Left;   No Known Allergies No current facility-administered medications on file prior to encounter.   Current Outpatient Medications on File Prior to Encounter  Medication Sig Dispense Refill  . gabapentin (NEURONTIN) 300 MG capsule Take 2 capsules (600 mg total) by mouth 4 (four) times daily for 30 days. 240 capsule 5  . Vitamin D, Ergocalciferol, (DRISDOL) 1.25 MG (50000 UNIT) CAPS capsule Take 50,000 Units by mouth once a week.     Social History   Socioeconomic History  . Marital status: Single    Spouse name: Not on file  . Number of children: Not on file  . Years of education: Not on file  . Highest education level: Not on file   Occupational History  . Not on file  Tobacco Use  . Smoking status: Never Smoker  . Smokeless tobacco: Never Used  Substance and Sexual Activity  . Alcohol use: Yes  . Drug use: Not Currently  . Sexual activity: Not on file  Other Topics Concern  . Not on file  Social History Narrative   ** Merged History Encounter **       Social Determinants of Health   Financial Resource Strain:   . Difficulty of Paying Living Expenses: Not on file  Food Insecurity:   . Worried About Programme researcher, broadcasting/film/video in the Last Year: Not on file  . Ran Out of Food in the Last Year: Not on file  Transportation Needs:   . Lack of Transportation (Medical): Not on file  . Lack of Transportation (Non-Medical): Not on file  Physical Activity:   . Days of Exercise per Week: Not on file  . Minutes of Exercise per Session: Not on file  Stress:   . Feeling of Stress : Not on file  Social Connections:   . Frequency of Communication with Friends and Family: Not on file  . Frequency of Social Gatherings with Friends and Family: Not on file  . Attends Religious Services: Not on file  . Active Member of Clubs or Organizations: Not on file  . Attends Banker Meetings: Not on file  . Marital Status: Not on file  Intimate Partner Violence:   . Fear of  Current or Ex-Partner: Not on file  . Emotionally Abused: Not on file  . Physically Abused: Not on file  . Sexually Abused: Not on file   Family History  Problem Relation Age of Onset  . Cancer Maternal Grandmother   . Diabetes Neg Hx   . Hypertension Neg Hx     OBJECTIVE:  Vitals:   12/22/19 0846  BP: 132/70  Pulse: 82  Resp: 16  Temp: 99.6 F (37.6 C)  TempSrc: Oral  SpO2: 99%    General appearance: ALERT; in no acute distress.  Head: NCAT Lungs: Normal respiratory effort CV: radial pulses 2+ bilaterally. Cap refill < 2 seconds Musculoskeletal:  Inspection: Skin warm, dry, clear and intact without obvious erythema, effusion, or  ecchymosis.  Palpation: tender to palpation R neck and shoulder ROM: FROM active and passive Strength: 5/5 shld abduction, 5/5 shld adduction, 5/5 elbow flexion, 5/5 elbow extension, 5/5 grip strength, 5/5 hip flexion, 5/5 knee abduction, 5/5 knee adduction, 5/5 knee flexion, 5/5 knee extension, 5/5 dorsiflexion, 5/5 plantar flexion Stability: Anterior/ posterior drawer intact Skin: warm and dry Neurologic: Ambulates without difficulty; Sensation intact about the upper/ lower extremities Psychological: alert and cooperative; normal mood and affect  DIAGNOSTIC STUDIES:  No results found.   ASSESSMENT & PLAN:  1. Neck pain   2. Muscle strain of right shoulder region, initial encounter     @NIMG @  Meds ordered this encounter  Medications  . cyclobenzaprine (FLEXERIL) 10 MG tablet    Sig: Take 1 tablet (10 mg total) by mouth 2 (two) times daily as needed for muscle spasms.    Dispense:  20 tablet    Refill:  0    Order Specific Question:   Supervising Provider    Answer:   Chase Picket [id number]  . ibuprofen (ADVIL) 800 MG tablet    Sig: Take 1 tablet (800 mg total) by mouth every 8 (eight) hours as needed for moderate pain.    Dispense:  21 tablet    Refill:  0    Order Specific Question:   Supervising Provider    Answer:   Chase Picket [id number]    Continue conservative management of rest, ice, and gentle stretches Take ibuprofen as needed for pain relief (may cause abdominal discomfort, ulcers, and GI bleeds avoid taking with other NSAIDs) Take cyclobenzaprine at nighttime for symptomatic relief. Avoid driving or operating heavy machinery while using medication. Follow up with PCP if symptoms persist, or ortho Return or go to the ER if you have any new or worsening symptoms (fever, chills, chest pain, abdominal pain, changes in bowel or bladder habits, pain radiating into lower legs, etc...)   [place] Controlled Substances Registry consulted for this patient. I feel the  risk/benefit ratio today is favorable for proceeding with this prescription for a controlled substance. Medication sedation precautions given.  Reviewed expectations re: course of current medical issues. Questions answered. Outlined signs and symptoms indicating need for more acute intervention. Patient verbalized understanding. After Visit Summary given.       Faustino Congress, NP 12/22/19 [phone]

## 2020-01-05 ENCOUNTER — Ambulatory Visit: Payer: Self-pay | Attending: Internal Medicine | Admitting: Physical Therapy

## 2020-01-05 ENCOUNTER — Encounter: Payer: Self-pay | Admitting: Physical Therapy

## 2020-01-05 ENCOUNTER — Other Ambulatory Visit: Payer: Self-pay

## 2020-01-05 DIAGNOSIS — M25672 Stiffness of left ankle, not elsewhere classified: Secondary | ICD-10-CM | POA: Insufficient documentation

## 2020-01-05 DIAGNOSIS — M79605 Pain in left leg: Secondary | ICD-10-CM | POA: Insufficient documentation

## 2020-01-05 DIAGNOSIS — M6281 Muscle weakness (generalized): Secondary | ICD-10-CM | POA: Insufficient documentation

## 2020-01-05 DIAGNOSIS — R2689 Other abnormalities of gait and mobility: Secondary | ICD-10-CM | POA: Insufficient documentation

## 2020-01-05 DIAGNOSIS — M62838 Other muscle spasm: Secondary | ICD-10-CM | POA: Insufficient documentation

## 2020-01-05 DIAGNOSIS — M542 Cervicalgia: Secondary | ICD-10-CM | POA: Insufficient documentation

## 2020-01-05 NOTE — Addendum Note (Signed)
Addended by: Milford Cage on: 01/05/2020 05:48 PM   Modules accepted: Orders
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
## 2020-01-06 ENCOUNTER — Telehealth: Payer: Self-pay | Admitting: Internal Medicine

## 2020-01-06 NOTE — Telephone Encounter (Signed)
Called and confirmed patient phone appointment

## 2020-01-07 ENCOUNTER — Other Ambulatory Visit: Payer: Self-pay

## 2020-01-07 ENCOUNTER — Encounter: Payer: Self-pay | Admitting: Physical Therapy

## 2020-01-07 ENCOUNTER — Ambulatory Visit: Payer: Self-pay | Admitting: Physical Therapy

## 2020-01-07 ENCOUNTER — Encounter: Payer: Self-pay | Admitting: Internal Medicine

## 2020-01-07 ENCOUNTER — Ambulatory Visit (INDEPENDENT_AMBULATORY_CARE_PROVIDER_SITE_OTHER): Payer: Medicaid Other

## 2020-01-07 ENCOUNTER — Ambulatory Visit (INDEPENDENT_AMBULATORY_CARE_PROVIDER_SITE_OTHER): Payer: Medicaid Other | Admitting: Internal Medicine

## 2020-01-07 DIAGNOSIS — R2689 Other abnormalities of gait and mobility: Secondary | ICD-10-CM

## 2020-01-07 DIAGNOSIS — M79605 Pain in left leg: Secondary | ICD-10-CM

## 2020-01-07 DIAGNOSIS — M25672 Stiffness of left ankle, not elsewhere classified: Secondary | ICD-10-CM

## 2020-01-07 DIAGNOSIS — M62838 Other muscle spasm: Secondary | ICD-10-CM

## 2020-01-07 DIAGNOSIS — M542 Cervicalgia: Secondary | ICD-10-CM

## 2020-01-07 DIAGNOSIS — M6281 Muscle weakness (generalized): Secondary | ICD-10-CM

## 2020-01-07 DIAGNOSIS — M25511 Pain in right shoulder: Secondary | ICD-10-CM

## 2020-01-07 NOTE — Progress Notes (Signed)
Virtual Visit via Telephone Note  I connected with Jacob Rogers, on 01/07/2020 at 9:14 AM by telephone due to the COVID-19 pandemic and verified that I am speaking with the correct person using two identifiers.   Consent: I discussed the limitations, risks, security and privacy concerns of performing an evaluation and management service by telephone and the availability of in person appointments. I also discussed with the patient that there may be a patient responsible charge related to this service. The patient expressed understanding and agreed to proceed.   Location of Patient: Home   Location of Provider: Clinic    Persons participating in Telemedicine visit: Rashidi Loh [facility] Dr. Earlene Plater      History of Present Illness: Patient has a visit to f/u on neck pain and muscle strain of right shoulder. Was seen at Urgent Care on 2/15. Reports this is effecting his entire right side of his body. Feels like his strength on the right side is significantly limited. Was prescribed Flexeril. He takes that occasionally. This pain has been occurring for<3 weeks, fairly constant.    Past Medical History:  Diagnosis Date  . Depression   . Paranoid schizophrenia (HCC)    No Known Allergies  Current Outpatient Medications on File Prior to Visit  Medication Sig Dispense Refill  . cyclobenzaprine (FLEXERIL) 10 MG tablet Take 1 tablet (10 mg total) by mouth 2 (two) times daily as needed for muscle spasms. 20 tablet 0  . gabapentin (NEURONTIN) 300 MG capsule Take 2 capsules (600 mg total) by mouth 4 (four) times daily for 30 days. 240 capsule 5  . ibuprofen (ADVIL) 800 MG tablet Take 1 tablet (800 mg total) by mouth every 8 (eight) hours as needed for moderate pain. 21 tablet 0  . Vitamin D, Ergocalciferol, (DRISDOL) 1.25 MG (50000 UNIT) CAPS capsule Take 50,000 Units by mouth once a week.     No current facility-administered medications on file prior to visit.     Observations/Objective: NAD. Speaking clearly.  Work of breathing normal.  Alert and oriented. Mood appropriate.   Assessment and Plan: 1. Acute pain of right shoulder 2. Neck pain Given weakness of right UE, will obtain cervical spine imaging. Also concerned that he has potentially strained his rotator cuff or possibly even torn it given the diminished strength on that side. Patient is already established with PT for other injuries and has an appointment this morning. I've asked him to have PT evaluate as well.  - DG Shoulder Right; Future - DG Cervical Spine Complete; Future       Follow Up Instructions: X-rays today    I discussed the assessment and treatment plan with the patient. The patient was provided an opportunity to ask questions and all were answered. The patient agreed with the plan and demonstrated an understanding of the instructions.   The patient was advised to call back or seek an in-person evaluation if the symptoms worsen or if the condition fails to improve as anticipated.     I provided 12 minutes total of non-face-to-face time during this encounter including median intraservice time, reviewing previous notes, investigations, ordering medications, medical decision making, coordinating care and patient verbalized understanding at the end of the visit.    Marcy Siren, D.O. Primary Care at [facility]  01/07/2020, 9:14 AM
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
## 2020-01-08 NOTE — Progress Notes (Signed)
Patient notified of results & recommendations. Expressed understanding.  Asked for a copy of the xray report & recommendations be printed.  Patient would also like a refill on the muscle relaxer to see if that will help with the muscle tightness

## 2020-01-08 NOTE — Progress Notes (Signed)
Patient notified of results & recommendations. Expressed understanding.

## 2020-01-09 ENCOUNTER — Other Ambulatory Visit: Payer: Self-pay | Admitting: Internal Medicine

## 2020-01-09 MED ORDER — CYCLOBENZAPRINE HCL 10 MG PO TABS: 10.0000 mg | ORAL_TABLET | Freq: Two times a day (BID) | ORAL | 1 refills | Status: DC | PRN

## 2020-01-12 ENCOUNTER — Other Ambulatory Visit: Payer: Self-pay | Admitting: Family Medicine

## 2020-01-12 MED FILL — CYCLOBENZAPRINE 10 MG TAB: 10 | 15 days supply | Qty: 30 | Fill #0

## 2020-01-12 MED FILL — GABAPENTIN 300 MG CAPSULE: 300 | 30 days supply | Qty: 240 | Fill #0

## 2020-01-12 MED FILL — VIT D2 1.25 MG (50,000 UNIT: 1.25 MG | 35 days supply | Qty: 5 | Fill #1

## 2020-01-16 ENCOUNTER — Ambulatory Visit: Payer: Self-pay | Admitting: Physical Therapy

## 2020-01-16 ENCOUNTER — Encounter: Payer: Self-pay | Admitting: Physical Therapy

## 2020-01-16 ENCOUNTER — Other Ambulatory Visit: Payer: Self-pay

## 2020-01-16 DIAGNOSIS — M62838 Other muscle spasm: Secondary | ICD-10-CM

## 2020-01-16 DIAGNOSIS — R2689 Other abnormalities of gait and mobility: Secondary | ICD-10-CM

## 2020-01-16 DIAGNOSIS — M79605 Pain in left leg: Secondary | ICD-10-CM

## 2020-01-16 DIAGNOSIS — M25672 Stiffness of left ankle, not elsewhere classified: Secondary | ICD-10-CM

## 2020-01-16 DIAGNOSIS — M542 Cervicalgia: Secondary | ICD-10-CM

## 2020-01-16 DIAGNOSIS — M6281 Muscle weakness (generalized): Secondary | ICD-10-CM
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
## 2020-01-16 NOTE — Patient Instructions (Addendum)
Over Head Pull: Narrow Grip       On back, knees bent, feet flat, band across thighs, elbows straight but relaxed. Pull hands apart (start). Keeping elbows straight, bring arms up and over head, hands toward floor. Keep pull steady on band. Hold momentarily. Return slowly, keeping pull steady, back to start. Repeat __10_ times. Band color ______Green    Side Pull: Double Arm   On back, knees bent, feet flat. Arms perpendicular to body, shoulder level, elbows straight but relaxed. Pull arms out to sides, elbows straight. Resistance band comes across collarbones, hands toward floor. Hold momentarily. Slowly return to starting position. Repeat __10_ times. Band color ____green_   Sash   On back, knees bent, feet flat, left hand on left hip, right hand above left. Pull right arm DIAGONALLY (hip to shoulder) across chest. Bring right arm along head toward floor. Hold momentarily. Slowly return to starting position. Repeat _10__ times. Do with left arm. Band color __Green Shoulder Rotation: Double Arm    On back, knees bent, feet flat, elbows tucked at sides, bent 90, hands palms up. Pull hands apart and down toward floor, keeping elbows near sides. Hold momentarily. Slowly return to starting position. Repeat _10__ times. Band color ____Green__    Flexibility: Corner Stretch    Standing in corner with hands just above shoulder level and feet ___12_ inches from corner, lean forward until a comfortable stretch is felt across chest. Hold __30__ seconds. Repeat ___3-5_ times per set. Do __1__ sets per session. Do ___2_ sessions per day.  http://orth.exer.us/342   Copyright  VHI. All rights reserved.

## 2020-01-19 ENCOUNTER — Encounter: Payer: Self-pay | Admitting: Physical Therapy

## 2020-01-19 ENCOUNTER — Other Ambulatory Visit: Payer: Self-pay

## 2020-01-19 ENCOUNTER — Ambulatory Visit: Payer: Self-pay | Admitting: Physical Therapy

## 2020-01-19 DIAGNOSIS — M25672 Stiffness of left ankle, not elsewhere classified: Secondary | ICD-10-CM

## 2020-01-19 DIAGNOSIS — M542 Cervicalgia: Secondary | ICD-10-CM

## 2020-01-19 DIAGNOSIS — M6281 Muscle weakness (generalized): Secondary | ICD-10-CM

## 2020-01-19 DIAGNOSIS — M62838 Other muscle spasm: Secondary | ICD-10-CM
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
## 2020-01-21 ENCOUNTER — Encounter: Payer: Self-pay | Admitting: Physical Therapy

## 2020-01-21 ENCOUNTER — Ambulatory Visit: Payer: Self-pay | Admitting: Physical Therapy

## 2020-01-21 ENCOUNTER — Other Ambulatory Visit: Payer: Self-pay

## 2020-01-21 DIAGNOSIS — M62838 Other muscle spasm: Secondary | ICD-10-CM

## 2020-01-21 DIAGNOSIS — M25672 Stiffness of left ankle, not elsewhere classified: Secondary | ICD-10-CM

## 2020-01-21 DIAGNOSIS — M542 Cervicalgia: Secondary | ICD-10-CM
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
## 2020-01-26 ENCOUNTER — Other Ambulatory Visit: Payer: Self-pay

## 2020-01-26 ENCOUNTER — Encounter: Payer: Self-pay | Admitting: Physical Therapy

## 2020-01-26 ENCOUNTER — Ambulatory Visit: Payer: Self-pay | Admitting: Physical Therapy

## 2020-01-26 DIAGNOSIS — M25672 Stiffness of left ankle, not elsewhere classified: Secondary | ICD-10-CM

## 2020-01-26 DIAGNOSIS — M6281 Muscle weakness (generalized): Secondary | ICD-10-CM

## 2020-01-26 DIAGNOSIS — M62838 Other muscle spasm: Secondary | ICD-10-CM

## 2020-01-26 DIAGNOSIS — M542 Cervicalgia: Secondary | ICD-10-CM
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
## 2020-01-28 ENCOUNTER — Ambulatory Visit: Payer: Self-pay | Admitting: Physical Therapy

## 2020-01-28 ENCOUNTER — Encounter: Payer: Self-pay | Admitting: Physical Therapy

## 2020-01-28 ENCOUNTER — Other Ambulatory Visit: Payer: Self-pay

## 2020-01-28 DIAGNOSIS — M6281 Muscle weakness (generalized): Secondary | ICD-10-CM

## 2020-01-28 DIAGNOSIS — M62838 Other muscle spasm: Secondary | ICD-10-CM

## 2020-01-28 DIAGNOSIS — M542 Cervicalgia: Secondary | ICD-10-CM

## 2020-01-28 DIAGNOSIS — M25672 Stiffness of left ankle, not elsewhere classified: Secondary | ICD-10-CM
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
## 2020-02-02 ENCOUNTER — Encounter: Payer: Self-pay | Admitting: Physical Therapy

## 2020-02-02 ENCOUNTER — Other Ambulatory Visit: Payer: Self-pay

## 2020-02-02 ENCOUNTER — Ambulatory Visit: Payer: Self-pay | Admitting: Physical Therapy

## 2020-02-02 DIAGNOSIS — M62838 Other muscle spasm: Secondary | ICD-10-CM

## 2020-02-02 DIAGNOSIS — M25672 Stiffness of left ankle, not elsewhere classified: Secondary | ICD-10-CM

## 2020-02-02 DIAGNOSIS — M6281 Muscle weakness (generalized): Secondary | ICD-10-CM

## 2020-02-02 DIAGNOSIS — M542 Cervicalgia: Secondary | ICD-10-CM
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
## 2020-02-04 ENCOUNTER — Ambulatory Visit: Payer: Self-pay | Admitting: Physical Therapy

## 2020-02-04 ENCOUNTER — Other Ambulatory Visit: Payer: Self-pay

## 2020-02-04 ENCOUNTER — Encounter: Payer: Self-pay | Admitting: Physical Therapy

## 2020-02-04 DIAGNOSIS — M542 Cervicalgia: Secondary | ICD-10-CM

## 2020-02-04 DIAGNOSIS — M6281 Muscle weakness (generalized): Secondary | ICD-10-CM

## 2020-02-04 DIAGNOSIS — M62838 Other muscle spasm: Secondary | ICD-10-CM

## 2020-02-04 DIAGNOSIS — M25672 Stiffness of left ankle, not elsewhere classified: Secondary | ICD-10-CM
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
## 2020-02-11 ENCOUNTER — Encounter: Payer: Self-pay | Admitting: Physical Therapy

## 2020-02-11 ENCOUNTER — Ambulatory Visit: Payer: Self-pay | Attending: Internal Medicine | Admitting: Physical Therapy

## 2020-02-11 ENCOUNTER — Other Ambulatory Visit: Payer: Self-pay

## 2020-02-11 DIAGNOSIS — M6281 Muscle weakness (generalized): Secondary | ICD-10-CM | POA: Insufficient documentation

## 2020-02-11 DIAGNOSIS — M542 Cervicalgia: Secondary | ICD-10-CM | POA: Insufficient documentation

## 2020-02-11 DIAGNOSIS — M25672 Stiffness of left ankle, not elsewhere classified: Secondary | ICD-10-CM | POA: Insufficient documentation

## 2020-02-11 DIAGNOSIS — R2689 Other abnormalities of gait and mobility: Secondary | ICD-10-CM | POA: Insufficient documentation

## 2020-02-11 DIAGNOSIS — M62838 Other muscle spasm: Secondary | ICD-10-CM | POA: Insufficient documentation

## 2020-02-11 DIAGNOSIS — M79605 Pain in left leg: Secondary | ICD-10-CM | POA: Insufficient documentation
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
## 2020-02-13 ENCOUNTER — Ambulatory Visit: Payer: Self-pay | Admitting: Physical Therapy

## 2020-02-13 ENCOUNTER — Encounter: Payer: Self-pay | Admitting: Physical Therapy

## 2020-02-13 ENCOUNTER — Other Ambulatory Visit: Payer: Self-pay

## 2020-02-13 DIAGNOSIS — M6281 Muscle weakness (generalized): Secondary | ICD-10-CM

## 2020-02-13 DIAGNOSIS — M25672 Stiffness of left ankle, not elsewhere classified: Secondary | ICD-10-CM

## 2020-02-13 DIAGNOSIS — M79605 Pain in left leg: Secondary | ICD-10-CM

## 2020-02-13 DIAGNOSIS — M62838 Other muscle spasm: Secondary | ICD-10-CM

## 2020-02-13 DIAGNOSIS — R2689 Other abnormalities of gait and mobility: Secondary | ICD-10-CM

## 2020-02-13 DIAGNOSIS — M542 Cervicalgia: Secondary | ICD-10-CM
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
## 2020-02-16 ENCOUNTER — Ambulatory Visit: Payer: Self-pay | Admitting: Physical Therapy

## 2020-02-16 ENCOUNTER — Other Ambulatory Visit: Payer: Self-pay

## 2020-02-16 ENCOUNTER — Encounter: Payer: Self-pay | Admitting: Physical Therapy

## 2020-02-16 DIAGNOSIS — M6281 Muscle weakness (generalized): Secondary | ICD-10-CM

## 2020-02-16 DIAGNOSIS — M25672 Stiffness of left ankle, not elsewhere classified: Secondary | ICD-10-CM

## 2020-02-16 DIAGNOSIS — M62838 Other muscle spasm: Secondary | ICD-10-CM

## 2020-02-16 DIAGNOSIS — M542 Cervicalgia: Secondary | ICD-10-CM

## 2020-02-16 DIAGNOSIS — M79605 Pain in left leg: Secondary | ICD-10-CM

## 2020-02-16 DIAGNOSIS — R2689 Other abnormalities of gait and mobility: Secondary | ICD-10-CM
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
## 2020-02-18 ENCOUNTER — Encounter: Payer: Self-pay | Admitting: Physical Therapy

## 2020-02-18 ENCOUNTER — Other Ambulatory Visit: Payer: Self-pay

## 2020-02-18 ENCOUNTER — Ambulatory Visit: Payer: Self-pay | Admitting: Physical Therapy

## 2020-02-18 DIAGNOSIS — M542 Cervicalgia: Secondary | ICD-10-CM

## 2020-02-18 DIAGNOSIS — M25672 Stiffness of left ankle, not elsewhere classified: Secondary | ICD-10-CM

## 2020-02-18 DIAGNOSIS — M6281 Muscle weakness (generalized): Secondary | ICD-10-CM

## 2020-02-18 DIAGNOSIS — R2689 Other abnormalities of gait and mobility: Secondary | ICD-10-CM

## 2020-02-18 DIAGNOSIS — M79605 Pain in left leg: Secondary | ICD-10-CM

## 2020-02-18 DIAGNOSIS — M62838 Other muscle spasm: Secondary | ICD-10-CM
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
## 2020-02-27 ENCOUNTER — Other Ambulatory Visit: Payer: Self-pay

## 2020-02-27 ENCOUNTER — Encounter: Payer: Self-pay | Admitting: Physical Therapy

## 2020-02-27 ENCOUNTER — Ambulatory Visit: Payer: Self-pay | Admitting: Physical Therapy

## 2020-02-27 DIAGNOSIS — M25672 Stiffness of left ankle, not elsewhere classified: Secondary | ICD-10-CM

## 2020-02-27 DIAGNOSIS — M542 Cervicalgia: Secondary | ICD-10-CM

## 2020-02-27 DIAGNOSIS — M6281 Muscle weakness (generalized): Secondary | ICD-10-CM

## 2020-02-27 DIAGNOSIS — M62838 Other muscle spasm: Secondary | ICD-10-CM
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
## 2020-03-05 ENCOUNTER — Other Ambulatory Visit: Payer: Self-pay

## 2020-03-05 ENCOUNTER — Encounter: Payer: Self-pay | Admitting: Physical Therapy

## 2020-03-05 ENCOUNTER — Ambulatory Visit: Payer: Self-pay | Admitting: Physical Therapy

## 2020-03-05 DIAGNOSIS — M6281 Muscle weakness (generalized): Secondary | ICD-10-CM

## 2020-03-05 DIAGNOSIS — R2689 Other abnormalities of gait and mobility: Secondary | ICD-10-CM

## 2020-03-05 DIAGNOSIS — M62838 Other muscle spasm: Secondary | ICD-10-CM

## 2020-03-05 DIAGNOSIS — M542 Cervicalgia: Secondary | ICD-10-CM

## 2020-03-05 DIAGNOSIS — M79605 Pain in left leg: Secondary | ICD-10-CM

## 2020-03-05 DIAGNOSIS — M25672 Stiffness of left ankle, not elsewhere classified: Secondary | ICD-10-CM
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
## 2020-03-08 ENCOUNTER — Ambulatory Visit: Payer: Medicaid Other | Admitting: Internal Medicine

## 2020-03-10 ENCOUNTER — Other Ambulatory Visit: Payer: Self-pay

## 2020-03-10 ENCOUNTER — Encounter: Payer: Self-pay | Admitting: Physical Therapy

## 2020-03-10 ENCOUNTER — Encounter: Payer: Self-pay | Admitting: Internal Medicine

## 2020-03-10 ENCOUNTER — Telehealth (INDEPENDENT_AMBULATORY_CARE_PROVIDER_SITE_OTHER): Payer: Medicaid Other | Admitting: Internal Medicine

## 2020-03-10 ENCOUNTER — Ambulatory Visit: Payer: Self-pay | Attending: Internal Medicine | Admitting: Physical Therapy

## 2020-03-10 DIAGNOSIS — M25672 Stiffness of left ankle, not elsewhere classified: Secondary | ICD-10-CM | POA: Insufficient documentation

## 2020-03-10 DIAGNOSIS — M6281 Muscle weakness (generalized): Secondary | ICD-10-CM | POA: Insufficient documentation

## 2020-03-10 DIAGNOSIS — M542 Cervicalgia: Secondary | ICD-10-CM

## 2020-03-10 DIAGNOSIS — M79605 Pain in left leg: Secondary | ICD-10-CM | POA: Insufficient documentation

## 2020-03-10 DIAGNOSIS — M25511 Pain in right shoulder: Secondary | ICD-10-CM

## 2020-03-10 DIAGNOSIS — M62838 Other muscle spasm: Secondary | ICD-10-CM | POA: Insufficient documentation

## 2020-03-10 DIAGNOSIS — G8929 Other chronic pain: Secondary | ICD-10-CM

## 2020-03-10 MED ORDER — METHOCARBAMOL 500 MG PO TABS: 500.0000 mg | ORAL_TABLET | Freq: Four times a day (QID) | ORAL | 0 refills | Status: DC | PRN

## 2020-03-10 MED FILL — METHOCARBAMOL 500 MG TABS: 500 | 30 days supply | Qty: 120 | Fill #0
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
## 2020-03-10 NOTE — Progress Notes (Signed)
Virtual Visit via Telephone Note  I connected with Jacob Rogers, on 03/10/2020 at 4:11 PM by telephone due to the COVID-19 pandemic and verified that I am speaking with the correct person using two identifiers.   Consent: I discussed the limitations, risks, security and privacy concerns of performing an evaluation and management service by telephone and the availability of in person appointments. I also discussed with the patient that there may be a patient responsible charge related to this service. The patient expressed understanding and agreed to proceed.   Location of Patient: Home   Location of Provider: Clinic    Persons participating in Telemedicine visit: [facility] Dr. Earlene Plater   History of Present Illness: Patient has a visit to follow up on cervalgia and right shoulder pain. He is followed by PT and reports that he his pain has improved significantly and his strength has improved. However, he is still having some minor pains/aches. He wants repeat x-rays to see if things have improved. Also wants to know if he should discontinue physical therapy. He feels that Flexeril works only in the fact that it makes him sedated.    Past Medical History:  Diagnosis Date  . Depression   . Paranoid schizophrenia ([facility])    No Known Allergies  Current Outpatient Medications on File Prior to Visit  Medication Sig Dispense Refill  . cyclobenzaprine (FLEXERIL) 10 MG tablet Take 1 tablet (10 mg total) by mouth 2 (two) times daily as needed for muscle spasms. 30 tablet 1  . gabapentin (NEURONTIN) 300 MG capsule TAKE 2 CAPSULES (600 MG TOTAL) BY MOUTH 4 (FOUR) TIMES DAILY FOR 30 DAYS. 240 capsule 5   No current facility-administered medications on file prior to visit.    Observations/Objective: NAD. Speaking clearly.  Work of breathing normal.  Alert and oriented. Mood appropriate.   Assessment and Plan: 1. Cervicalgia 2. Chronic right shoulder pain Patient  is followed by PT, has had great improvement with his therapy. Encouraged to complete prescribed PT. Discussed that there would be low yield of repeating x-ray as recent imaging in March 2021 was unremarkable. For pain control, will attempt to switch to muscle relaxer that is typically less sedating.  - methocarbamol (ROBAXIN) 500 MG tablet; Take 1 tablet (500 mg total) by mouth 4 (four) times daily as needed for muscle spasms.  Dispense: 120 tablet; Refill: 0   Follow Up Instructions: Routine medical care and PRN    I discussed the assessment and treatment plan with the patient. The patient was provided an opportunity to ask questions and all were answered. The patient agreed with the plan and demonstrated an understanding of the instructions.   The patient was advised to call back or seek an in-person evaluation if the symptoms worsen or if the condition fails to improve as anticipated.     I provided 12 minutes total of non-face-to-face time during this encounter including median intraservice time, reviewing previous notes, investigations, ordering medications, medical decision making, coordinating care and patient verbalized understanding at the end of the visit.    Marcy Siren, D.O. Primary Care at [facility]  03/10/2020, 4:11 PM

## 2020-03-17 ENCOUNTER — Other Ambulatory Visit: Payer: Self-pay

## 2020-03-17 ENCOUNTER — Encounter: Payer: Self-pay | Admitting: Physical Therapy

## 2020-03-17 ENCOUNTER — Ambulatory Visit: Payer: Self-pay | Admitting: Physical Therapy

## 2020-03-17 DIAGNOSIS — M79605 Pain in left leg: Secondary | ICD-10-CM

## 2020-03-17 DIAGNOSIS — M542 Cervicalgia: Secondary | ICD-10-CM

## 2020-03-17 DIAGNOSIS — M62838 Other muscle spasm: Secondary | ICD-10-CM

## 2020-03-17 DIAGNOSIS — M6281 Muscle weakness (generalized): Secondary | ICD-10-CM

## 2020-03-17 DIAGNOSIS — M25672 Stiffness of left ankle, not elsewhere classified: Secondary | ICD-10-CM
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
## 2020-03-25 ENCOUNTER — Ambulatory Visit: Payer: Self-pay | Admitting: Physical Therapy

## 2020-03-25 ENCOUNTER — Encounter: Payer: Self-pay | Admitting: Physical Therapy

## 2020-03-25 ENCOUNTER — Other Ambulatory Visit: Payer: Self-pay

## 2020-03-25 DIAGNOSIS — M542 Cervicalgia: Secondary | ICD-10-CM

## 2020-03-25 DIAGNOSIS — M62838 Other muscle spasm: Secondary | ICD-10-CM

## 2020-03-25 DIAGNOSIS — M6281 Muscle weakness (generalized): Secondary | ICD-10-CM

## 2020-03-25 DIAGNOSIS — M25672 Stiffness of left ankle, not elsewhere classified: Secondary | ICD-10-CM
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
## 2020-04-02 MED FILL — GABAPENTIN 300 MG CAPSULE: 300 | 30 days supply | Qty: 240 | Fill #1

## 2020-04-14 ENCOUNTER — Ambulatory Visit: Payer: Self-pay | Admitting: Physical Therapy

## 2020-04-22 ENCOUNTER — Other Ambulatory Visit: Payer: Self-pay

## 2020-04-22 ENCOUNTER — Ambulatory Visit: Payer: Medicaid Other | Attending: Internal Medicine | Admitting: Physical Therapy

## 2020-04-22 DIAGNOSIS — M25672 Stiffness of left ankle, not elsewhere classified: Secondary | ICD-10-CM

## 2020-04-22 DIAGNOSIS — M6281 Muscle weakness (generalized): Secondary | ICD-10-CM | POA: Insufficient documentation

## 2020-04-22 DIAGNOSIS — M542 Cervicalgia: Secondary | ICD-10-CM | POA: Insufficient documentation

## 2020-04-22 DIAGNOSIS — M62838 Other muscle spasm: Secondary | ICD-10-CM | POA: Insufficient documentation

## 2020-04-22 DIAGNOSIS — M79605 Pain in left leg: Secondary | ICD-10-CM

## 2020-04-22 DIAGNOSIS — R2689 Other abnormalities of gait and mobility: Secondary | ICD-10-CM | POA: Insufficient documentation
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
## 2020-05-03 ENCOUNTER — Encounter: Payer: Medicaid Other | Admitting: Physical Therapy

## 2020-05-19 ENCOUNTER — Ambulatory Visit (HOSPITAL_COMMUNITY)
Admission: EM | Admit: 2020-05-19 | Discharge: 2020-05-19 | Disposition: A | Discharge status: Home / Self Care | Payer: Medicaid Other | Attending: Internal Medicine | Admitting: Internal Medicine

## 2020-05-19 ENCOUNTER — Encounter (HOSPITAL_COMMUNITY): Payer: Self-pay | Admitting: Emergency Medicine

## 2020-05-19 ENCOUNTER — Other Ambulatory Visit: Payer: Self-pay

## 2020-05-19 DIAGNOSIS — S61210A Laceration without foreign body of right index finger without damage to nail, initial encounter: Secondary | ICD-10-CM

## 2020-05-19 DIAGNOSIS — Z23 Encounter for immunization: Secondary | ICD-10-CM

## 2020-05-19 MED ORDER — TETANUS-DIPHTH-ACELL PERTUSSIS 5-2.5-18.5 LF-MCG/0.5 IM SUSP
INTRAMUSCULAR | Status: AC
  Filled 2020-05-19: qty 0.5

## 2020-05-19 MED ORDER — TETANUS-DIPHTH-ACELL PERTUSSIS 5-2.5-18.5 LF-MCG/0.5 IM SUSP
0.5000 mL | Freq: Once | INTRAMUSCULAR | Status: AC
  Administered 2020-05-19: 0.5 mL via INTRAMUSCULAR
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
## 2020-05-19 NOTE — Discharge Instructions (Addendum)
Your wound looks like it is healing very nicely.  Stop using the hydrogen peroxide as we discussed.  Continue keeping area clean and dry.  You can also use Neosporin.
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
## 2020-06-30 MED FILL — GABAPENTIN 300 MG CAPSULE: 300 | 30 days supply | Qty: 240 | Fill #2

## 2020-08-27 MED FILL — GABAPENTIN 300 MG CAPSULE: 300 | 30 days supply | Qty: 240 | Fill #3

## 2020-09-06 MED FILL — GABAPENTIN 300 MG CAPSULE: 300 | 30 days supply | Qty: 240 | Fill #3

## 2020-10-21 ENCOUNTER — Emergency Department (HOSPITAL_COMMUNITY)
Admission: EM | Admit: 2020-10-21 | Discharge: 2020-10-21 | Disposition: A | Discharge status: Home / Self Care | Payer: Medicaid Other | Attending: Emergency Medicine | Admitting: Emergency Medicine

## 2020-10-21 ENCOUNTER — Other Ambulatory Visit: Payer: Self-pay

## 2020-10-21 ENCOUNTER — Other Ambulatory Visit: Payer: Self-pay | Admitting: Student

## 2020-10-21 DIAGNOSIS — Z202 Contact with and (suspected) exposure to infections with a predominantly sexual mode of transmission: Secondary | ICD-10-CM | POA: Insufficient documentation

## 2020-10-21 LAB — HIV ANTIBODY (ROUTINE TESTING W REFLEX): HIV Screen 4th Generation wRfx: NONREACTIVE

## 2020-10-21 MED ORDER — LIDOCAINE HCL (PF) 1 % IJ SOLN
1.0000 mL | Freq: Once | INTRAMUSCULAR | Status: AC
  Administered 2020-10-21: 1 mL
  Filled 2020-10-21: qty 5

## 2020-10-21 MED ORDER — CEFTRIAXONE SODIUM 500 MG IJ SOLR
500.0000 mg | Freq: Once | INTRAMUSCULAR | Status: AC
  Administered 2020-10-21: 500 mg via INTRAMUSCULAR
  Filled 2020-10-21: qty 500

## 2020-10-21 MED ORDER — DOXYCYCLINE HYCLATE 100 MG PO CAPS: 100.0000 mg | ORAL_CAPSULE | Freq: Two times a day (BID) | ORAL | 0 refills | Status: DC

## 2020-10-21 MED FILL — DOXYCYCLINE HYCLATE 100 MG: 100 | 7 days supply | Qty: 14 | Fill #0

## 2020-10-21 NOTE — ED Triage Notes (Signed)
Pt requesting STD check. Denies symptoms.

## 2020-10-21 NOTE — Discharge Instructions (Signed)
You were tested and treated today for gonorrhea and chlamydia. You will need to complete entire course of doxycycline for complete treatment of chlamydia since you were exposed to this. You were also tested for HIV and syphilis. You will be called in the next 2 to 3 days if any results are positive, you can also review negative results online. Please notify any partners so that they can be tested and treated as well. Avoid sexual activity until you have completed all antibiotics. Please make sure you are using protection when sexually active to help prevent further STDs.  You can go to the health department for any future STD testing needs.

## 2020-10-21 NOTE — ED Notes (Signed)
Patient verbalizes understanding of discharge instructions. Opportunity for questioning and answers were provided. Armband removed by staff, pt discharged from ED.  
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
## 2020-10-22 LAB — GC/CHLAMYDIA PROBE AMP (~~LOC~~) NOT AT ARMC
Chlamydia: POSITIVE — AB
Comment: NEGATIVE
Comment: NORMAL
Neisseria Gonorrhea: NEGATIVE

## 2020-10-22 LAB — RPR: RPR Ser Ql: NONREACTIVE

## 2021-02-17 ENCOUNTER — Other Ambulatory Visit: Payer: Self-pay

## 2021-02-17 ENCOUNTER — Ambulatory Visit: Payer: Self-pay | Admitting: Physician Assistant

## 2021-02-17 VITALS — BP 128/72 | HR 65 | Temp 98.2°F | Resp 18 | Ht 71.0 in | Wt 183.0 lb

## 2021-02-17 DIAGNOSIS — J0100 Acute maxillary sinusitis, unspecified: Secondary | ICD-10-CM

## 2021-02-17 LAB — POC COVID19 BINAXNOW: SARS Coronavirus 2 Ag: NEGATIVE

## 2021-02-17 MED ORDER — FLUTICASONE PROPIONATE 50 MCG/ACT NA SUSP: 2.0000 | Freq: Every day | NASAL | 6 refills | Status: DC

## 2021-02-17 NOTE — Patient Instructions (Signed)
I encourage you to continue your daily allergy medication, you will use Flonase twice a day until your symptoms improve and then you can use it as needed.  I encourage you to stay very well-hydrated and get plenty of rest.  Please let us know if there is anything else we can do for you.  Roney Jaffe, PA-C Physician Assistant [facility] Medicine [email or web address]    Sinusitis, Adult Sinusitis is inflammation of your sinuses. Sinuses are hollow spaces in the bones around your face. Your sinuses are located:  Around your eyes.  In the middle of your forehead.  Behind your nose.  In your cheekbones. Mucus normally drains out of your sinuses. When your nasal tissues become inflamed or swollen, mucus can become trapped or blocked. This allows bacteria, viruses, and fungi to grow, which leads to infection. Most infections of the sinuses are caused by a virus. Sinusitis can develop quickly. It can last for up to 4 weeks (acute) or for more than 12 weeks (chronic). Sinusitis often develops after a cold. What are the causes? This condition is caused by anything that creates swelling in the sinuses or stops mucus from draining. This includes:  Allergies.  Asthma.  Infection from bacteria or viruses.  Deformities or blockages in your nose or sinuses.  Abnormal growths in the nose (nasal polyps).  Pollutants, such as chemicals or irritants in the air.  Infection from fungi (rare). What increases the risk? You are more likely to develop this condition if you:  Have a weak body defense system (immune system).  Do a lot of swimming or diving.  Overuse nasal sprays.  Smoke. What are the signs or symptoms? The main symptoms of this condition are pain and a feeling of pressure around the affected sinuses. Other symptoms include:  Stuffy nose or congestion.  Thick drainage from your nose.  Swelling and warmth over the affected  sinuses.  Headache.  Upper toothache.  A cough that may get worse at night.  Extra mucus that collects in the throat or the back of the nose (postnasal drip).  Decreased sense of smell and taste.  Fatigue.  A fever.  Sore throat.  Bad breath. How is this diagnosed? This condition is diagnosed based on:  Your symptoms.  Your medical history.  A physical exam.  Tests to find out if your condition is acute or chronic. This may include: ? Checking your nose for nasal polyps. ? Viewing your sinuses using a device that has a light (endoscope). ? Testing for allergies or bacteria. ? Imaging tests, such as an MRI or CT scan. In rare cases, a bone biopsy may be done to rule out more serious types of fungal sinus disease. How is this treated? Treatment for sinusitis depends on the cause and whether your condition is chronic or acute.  If caused by a virus, your symptoms should go away on their own within 10 days. You may be given medicines to relieve symptoms. They include: ? Medicines that shrink swollen nasal passages (topical intranasal decongestants). ? Medicines that treat allergies (antihistamines). ? A spray that eases inflammation of the nostrils (topical intranasal corticosteroids). ? Rinses that help get rid of thick mucus in your nose (nasal saline washes).  If caused by bacteria, your health care provider may recommend waiting to see if your symptoms improve. Most bacterial infections will get better without antibiotic medicine. You may be given antibiotics if you have: ? A severe infection. ? A weak immune system.  If caused by narrow nasal passages or nasal polyps, you may need to have surgery. Follow these instructions at home: Medicines  Take, use, or apply over-the-counter and prescription medicines only as told by your health care provider. These may include nasal sprays.  If you were prescribed an antibiotic medicine, take it as told by your health care  provider. Do not stop taking the antibiotic even if you start to feel better. Hydrate and humidify  Drink enough fluid to keep your urine pale yellow. Staying hydrated will help to thin your mucus.  Use a cool mist humidifier to keep the humidity level in your home above 50%.  Inhale steam for 10-15 minutes, 3-4 times a day, or as told by your health care provider. You can do this in the bathroom while a hot shower is running.  Limit your exposure to cool or dry air.   Rest  Rest as much as possible.  Sleep with your head raised (elevated).  Make sure you get enough sleep each night. General instructions  Apply a warm, moist washcloth to your face 3-4 times a day or as told by your health care provider. This will help with discomfort.  Wash your hands often with soap and water to reduce your exposure to germs. If soap and water are not available, use hand sanitizer.  Do not smoke. Avoid being around people who are smoking (secondhand smoke).  Keep all follow-up visits as told by your health care provider. This is important.   Contact a health care provider if:  You have a fever.  Your symptoms get worse.  Your symptoms do not improve within 10 days. Get help right away if:  You have a severe headache.  You have persistent vomiting.  You have severe pain or swelling around your face or eyes.  You have vision problems.  You develop confusion.  Your neck is stiff.  You have trouble breathing. Summary  Sinusitis is soreness and inflammation of your sinuses. Sinuses are hollow spaces in the bones around your face.  This condition is caused by nasal tissues that become inflamed or swollen. The swelling traps or blocks the flow of mucus. This allows bacteria, viruses, and fungi to grow, which leads to infection.  If you were prescribed an antibiotic medicine, take it as told by your health care provider. Do not stop taking the antibiotic even if you start to feel  better.  Keep all follow-up visits as told by your health care provider. This is important. This information is not intended to replace advice given to you by your health care provider. Make sure you discuss any questions you have with your health care provider. Document Revised: 03/25/2018 Document Reviewed: 03/25/2018 Elsevier Patient Education  2021 [employer].

## 2021-02-17 NOTE — Progress Notes (Signed)
Patient reports HA beginning Monday night and being constant til today. Patient reports tinges of blood on tissue last night and this morning. Patient denies ever having seasonal allergies. Patient denies any household members experiencing any similar symptoms.

## 2021-02-17 NOTE — Progress Notes (Signed)
Established Patient Office Visit  Subjective:  Patient ID: Jacob Rogers, male    DOB: [birth year]  Age: 35 y.o. MRN: [id number]  CC:  Chief Complaint  Patient presents with  . Headache  . Sinusitis    HPI Jacob Rogers reports that he has been having clear nasal congestion, sneezing, sinus pressure headaches 3 days ago, states that he did see a small amount of blood on his tissue last night and again this morning.  Has been using Allegra Mucinex and ibuprofen with a little relief.  Denies any sick contacts.  Does endorse history of seasonal allergies   Past Medical History:  Diagnosis Date  . Depression   . Paranoid schizophrenia [facility])     Past Surgical History:  Procedure Laterality Date  . FRACTURE SURGERY     right foot  . I & D EXTREMITY Left 11/03/2018   Procedure: IRRIGATION AND DEBRIDEMENT, OPEN FRACTURE;  Surgeon: Terance Hart, MD;  Location: [facility];  Service: Orthopedics;  Laterality: Left;  . INTRAMEDULLARY (IM) NAIL INTERTROCHANTERIC Left 11/03/2018   Procedure: INTRAMEDULLARY (IM) NAIL, LEFT FEMUR;  Surgeon: Terance Hart, MD;  Location: [facility] OR;  Service: Orthopedics;  Laterality: Left;    Family History  Problem Relation Age of Onset  . Cancer Maternal Grandmother   . Diabetes Neg Hx   . Hypertension Neg Hx     Social History   Socioeconomic History  . Marital status: Single    Spouse name: Not on file  . Number of children: Not on file  . Years of education: Not on file  . Highest education level: Not on file  Occupational History  . Not on file  Tobacco Use  . Smoking status: Never Smoker  . Smokeless tobacco: Never Used  Substance and Sexual Activity  . Alcohol use: Yes  . Drug use: Not Currently  . Sexual activity: Not on file  Other Topics Concern  . Not on file  Social History Narrative   ** Merged History Encounter **       Social Determinants of Health   Financial Resource Strain: Not on file  Food Insecurity:  Not on file  Transportation Needs: Not on file  Physical Activity: Not on file  Stress: Not on file  Social Connections: Not on file  Intimate Partner Violence: Not on file    Outpatient Medications Prior to Visit  Medication Sig Dispense Refill  . gabapentin (NEURONTIN) 300 MG capsule TAKE 2 CAPSULES (600 MG TOTAL) BY MOUTH 4 (FOUR) TIMES DAILY FOR 30 DAYS. 240 capsule 5  . methocarbamol (ROBAXIN) 500 MG tablet Take 1 tablet (500 mg total) by mouth 4 (four) times daily as needed for muscle spasms. 120 tablet 0  . doxycycline (VIBRA-TABS) 100 MG tablet TAKE 1 CAPSULE (100 MG TOTAL) BY MOUTH 2 (TWO) TIMES DAILY. 14 tablet 0  . doxycycline (VIBRAMYCIN) 100 MG capsule Take 1 capsule (100 mg total) by mouth 2 (two) times daily. 14 capsule 0   No facility-administered medications prior to visit.    No Known Allergies  ROS Review of Systems  Constitutional: Negative for chills and fever.  HENT: Positive for congestion, postnasal drip, sinus pressure and sneezing. Negative for ear pain, sore throat and trouble swallowing.   Eyes: Positive for itching.  Respiratory: Negative for cough, shortness of breath and wheezing.   Cardiovascular: Negative for chest pain.  Gastrointestinal: Negative for nausea and vomiting.  Endocrine: Negative.   Genitourinary: Negative.   Musculoskeletal: Negative  for myalgias.  Skin: Negative.   Allergic/Immunologic: Positive for environmental allergies.  Neurological: Positive for headaches.  Hematological: Negative.   Psychiatric/Behavioral: Negative.       Objective:    Physical Exam Vitals and nursing note reviewed.  Constitutional:      Appearance: Normal appearance.  HENT:     Head: Normocephalic and atraumatic.     Right Ear: Tympanic membrane, ear canal and external ear normal.     Left Ear: Tympanic membrane, ear canal and external ear normal.     Nose:     Right Turbinates: Swollen.     Left Turbinates: Swollen.     Right Sinus:  Maxillary sinus tenderness and frontal sinus tenderness present.     Left Sinus: Maxillary sinus tenderness present.     Mouth/Throat:     Mouth: Mucous membranes are moist.     Pharynx: Oropharynx is clear.     Tonsils: No tonsillar exudate or tonsillar abscesses.  Eyes:     Extraocular Movements: Extraocular movements intact.     Conjunctiva/sclera: Conjunctivae normal.     Pupils: Pupils are equal, round, and reactive to light.  Cardiovascular:     Rate and Rhythm: Normal rate and regular rhythm.     Pulses: Normal pulses.     Heart sounds: Normal heart sounds.  Pulmonary:     Effort: Pulmonary effort is normal.     Breath sounds: Normal breath sounds. No wheezing.  Musculoskeletal:        General: Normal range of motion.     Cervical back: Normal range of motion and neck supple.  Lymphadenopathy:     Cervical: No cervical adenopathy.  Skin:    General: Skin is warm.  Neurological:     General: No focal deficit present.     Mental Status: He is alert and oriented to person, place, and time.  Psychiatric:        Mood and Affect: Mood normal.        Behavior: Behavior normal.        Thought Content: Thought content normal.        Judgment: Judgment normal.     BP 128/72 (BP Location: Left Arm, Patient Position: Sitting, Cuff Size: Normal)   Pulse 65   Temp 98.2 F (36.8 C) (Oral)   Resp 18   Ht 5\' 11"  (1.803 m)   Wt 183 lb (83 kg)   SpO2 98%   BMI 25.52 kg/m  Wt Readings from Last 3 Encounters:  02/17/21 183 lb (83 kg)  10/21/20 170 lb (77.1 kg)  06/30/19 184 lb (83.5 kg)     Health Maintenance Due  Topic Date Due  . Hepatitis C Screening  Never done    There are no preventive care reminders to display for this patient.  No results found for: TSH Lab Results  Component Value Date   WBC 5.8 11/27/2018   HGB 12.6 (L) 11/27/2018   HCT 39.1 11/27/2018   MCV 88 11/27/2018   PLT 443 11/27/2018   Lab Results  Component Value Date   NA 140 11/27/2018    K 4.5 11/27/2018   CO2 23 11/27/2018   GLUCOSE 89 11/27/2018   BUN 17 11/27/2018   CREATININE 1.26 11/27/2018   BILITOT 0.7 11/27/2018   ALKPHOS 134 (H) 11/27/2018   AST 19 11/27/2018   ALT 14 11/27/2018   PROT 8.0 11/27/2018   ALBUMIN 4.3 11/27/2018   CALCIUM 9.7 11/27/2018   ANIONGAP 13 11/02/2018  No results found for: CHOL No results found for: HDL No results found for: LDLCALC No results found for: TRIG No results found for: CHOLHDL No results found for: WLNL8X    Assessment & Plan:   Problem List Items Addressed This Visit   None   Visit Diagnoses    Acute non-recurrent maxillary sinusitis    -  Primary   Relevant Medications   fluticasone (FLONASE) 50 MCG/ACT nasal spray   Other Relevant Orders   POC COVID-19 (Completed)    1. Acute non-recurrent maxillary sinusitis Rapid Covid test negative.  Patient education given, continue supportive care, trial Flonase.  Red flags given for prompt reevaluation. - POC COVID-19 - fluticasone (FLONASE) 50 MCG/ACT nasal spray; Place 2 sprays into both nostrils daily.  Dispense: 16 g; Refill: 6    I have reviewed the patient's medical history (PMH, PSH, Social History, Family History, Medications, and allergies) , and have been updated if relevant. I spent 23 minutes reviewing chart and  face to face time with patient.     Meds ordered this encounter  Medications  . fluticasone (FLONASE) 50 MCG/ACT nasal spray    Sig: Place 2 sprays into both nostrils daily.    Dispense:  16 g    Refill:  6    Order Specific Question:   Supervising Provider    Answer:   Storm Frisk [[id number]]    Follow-up: Return if symptoms worsen or fail to improve.    Kasandra Knudsen Mayers, PA-C

## 2021-05-17 ENCOUNTER — Encounter: Payer: Self-pay | Admitting: Physician Assistant

## 2021-05-17 ENCOUNTER — Other Ambulatory Visit: Payer: Self-pay

## 2021-05-17 ENCOUNTER — Telehealth: Payer: BC Managed Care – PPO | Admitting: Physician Assistant

## 2021-05-17 DIAGNOSIS — M5410 Radiculopathy, site unspecified: Secondary | ICD-10-CM | POA: Diagnosis not present

## 2021-05-17 DIAGNOSIS — Z1159 Encounter for screening for other viral diseases: Secondary | ICD-10-CM

## 2021-05-17 DIAGNOSIS — E559 Vitamin D deficiency, unspecified: Secondary | ICD-10-CM

## 2021-05-17 DIAGNOSIS — Z8616 Personal history of COVID-19: Secondary | ICD-10-CM

## 2021-05-17 MED ORDER — GABAPENTIN 300 MG PO CAPS
600.0000 mg | ORAL_CAPSULE | Freq: Four times a day (QID) | ORAL | 5 refills | Status: DC
  Filled 2021-05-17: qty 240, 30d supply, fill #0
  Filled 2021-10-13 – 2021-11-07 (×3): qty 240, 30d supply, fill #1

## 2021-05-17 NOTE — Progress Notes (Signed)
Patient tested positive. Patient complains of diarrhea with last episode this morning. Patient has used mucinex for cough. Patient only had body aches week. Patient has altered smell. Taste is still present. Patient shares household is positive. Patient is requesting a refill on gabapentin.  Patient is currently at [facility] to obtain another home test.

## 2021-05-17 NOTE — Patient Instructions (Signed)
I sent your refill to your pharmacy.  I hope that you feel better soon.  Please make sure to have your labs completed in a timely fashion.  Either call Primary Care at [facility] and let them know when you are coming in to have labs completed, or you can present to the mobile medicine unit and we will be happy to complete your labs there.  You can locate the mobile medicine unit by going to [email or web address] and searching for mobile medicine.  Roney Jaffe, PA-C Physician Assistant [facility] Mobile Medicine [email or web address]   10 Things You Can Do to Manage Your COVID-19 Symptoms at Home If you have possible or confirmed COVID-19 Stay home except to get medical care. Monitor your symptoms carefully. If your symptoms get worse, call your healthcare provider immediately. Get rest and stay hydrated. If you have a medical appointment, call the healthcare provider ahead of time and tell them that you have or may have COVID-19. For medical emergencies, call 911 and notify the dispatch personnel that you have or may have COVID-19. Cover your cough and sneezes with a tissue or use the inside of your elbow. Wash your hands often with soap and water for at least 20 seconds or clean your hands with an alcohol-based hand sanitizer that contains at least 60% alcohol. As much as possible, stay in a specific room and away from other people in your home. Also, you should use a separate bathroom, if available. If you need to be around other people in or outside of the home, wear a mask. Avoid sharing personal items with other people in your household, like dishes, towels, and bedding. Clean all surfaces that are touched often, like counters, tabletops, and doorknobs. Use household cleaning sprays or wipes according to the label instructions. [email or web address] 05/21/2020 This information is not intended to replace advice given to you by your health care provider. Make  sure you discuss any questions you have with your healthcare provider. Document Revised: 12/10/2020 Document Reviewed: 12/10/2020 Elsevier Patient Education  2022 [employer].

## 2021-05-17 NOTE — Progress Notes (Signed)
Established Patient Office Visit  Subjective:  Patient ID: Jacob Rogers, male    DOB: Sep 29, 1986  Age: 35 y.o. MRN: [id number]  CC:  Chief Complaint  Patient presents with   Covid Positive    05/11/21   Virtual Visit via Telephone Note  I connected with [name] on 05/17/21 at  1:30 PM EDT by telephone and verified that I am speaking with the correct person using two identifiers.  Location: Patient: Home Provider:Primary Care at [facility]   I discussed the limitations, risks, security and privacy concerns of performing an evaluation and management service by telephone and the availability of in person appointments. I also discussed with the patient that there may be a patient responsible charge related to this service. The patient expressed understanding and agreed to proceed.   History of Present Illness: Patient requests refills on his gabapentin.  Reports that he uses it 4 times a day several times a week, but does try to use it as needed, he does endorse that some days he only has to use it twice a day.  Reports that he is currently recovering from a COVID infection, states that he started having symptoms on May 10, 2021, tested positive on May 11, 2021 with a home test.  Reports his symptoms as mild, is eating and drinking okay, is using supportive measures.     Observations/Objective: Medical history and current medications reviewed, no physical exam completed  Past Medical History:  Diagnosis Date   Depression    Paranoid schizophrenia (HCC)     Past Surgical History:  Procedure Laterality Date   FRACTURE SURGERY     right foot   I & D EXTREMITY Left 11/03/2018   Procedure: IRRIGATION AND DEBRIDEMENT, OPEN FRACTURE;  Surgeon: Terance Hart, MD;  Location[facility] OR;  Service: Orthopedics;  Laterality: Left;   INTRAMEDULLARY (IM) NAIL INTERTROCHANTERIC Left 11/03/2018   Procedure: INTRAMEDULLARY (IM) NAIL, LEFT FEMUR;  Surgeon: Terance Hart,  MD;  Location: [facility] OR;  Service: Orthopedics;  Laterality: Left;    Family History  Problem Relation Age of Onset   Cancer Maternal Grandmother    Diabetes Neg Hx    Hypertension Neg Hx     Social History   Socioeconomic History   Marital status: Single    Spouse name: Not on file   Number of children: Not on file   Years of education: Not on file   Highest education level: Not on file  Occupational History   Not on file  Tobacco Use   Smoking status: Never   Smokeless tobacco: Never  Substance and Sexual Activity   Alcohol use: Yes   Drug use: Not Currently   Sexual activity: Not on file  Other Topics Concern   Not on file  Social History Narrative   ** Merged History Encounter **       Social Determinants of Health   Financial Resource Strain: Not on file  Food Insecurity: Not on file  Transportation Needs: Not on file  Physical Activity: Not on file  Stress: Not on file  Social Connections: Not on file  Intimate Partner Violence: Not on file    Outpatient Medications Prior to Visit  Medication Sig Dispense Refill   fluticasone (FLONASE) 50 MCG/ACT nasal spray Place 2 sprays into both nostrils daily. 16 g 6   gabapentin (NEURONTIN) 300 MG capsule TAKE 2 CAPSULES (600 MG TOTAL) BY MOUTH 4 (FOUR) TIMES DAILY FOR 30 DAYS. 240 capsule 5  methocarbamol (ROBAXIN) 500 MG tablet Take 1 tablet (500 mg total) by mouth 4 (four) times daily as needed for muscle spasms. 120 tablet 0   No facility-administered medications prior to visit.    No Known Allergies  ROS Review of Systems  Constitutional:  Negative for chills and fever.  HENT:  Negative for congestion, sinus pressure, sore throat and trouble swallowing.   Eyes: Negative.   Respiratory:  Positive for cough. Negative for shortness of breath and wheezing.   Cardiovascular:  Negative for chest pain.  Gastrointestinal:  Positive for diarrhea.  Endocrine: Negative.   Genitourinary: Negative.   Musculoskeletal:   Positive for arthralgias.  Skin: Negative.   Allergic/Immunologic: Negative.   Neurological: Negative.   Hematological: Negative.   Psychiatric/Behavioral: Negative.       Objective:      There were no vitals taken for this visit. Wt Readings from Last 3 Encounters:  02/17/21 183 lb (83 kg)  10/21/20 170 lb (77.1 kg)  06/30/19 184 lb (83.5 kg)     Health Maintenance Due  Topic Date Due   Hepatitis C Screening  Never done    There are no preventive care reminders to display for this patient.  No results found for: TSH Lab Results  Component Value Date   WBC 5.8 11/27/2018   HGB 12.6 (L) 11/27/2018   HCT 39.1 11/27/2018   MCV 88 11/27/2018   PLT 443 11/27/2018   Lab Results  Component Value Date   NA 140 11/27/2018   K 4.5 11/27/2018   CO2 23 11/27/2018   GLUCOSE 89 11/27/2018   BUN 17 11/27/2018   CREATININE 1.26 11/27/2018   BILITOT 0.7 11/27/2018   ALKPHOS 134 (H) 11/27/2018   AST 19 11/27/2018   ALT 14 11/27/2018   PROT 8.0 11/27/2018   ALBUMIN 4.3 11/27/2018   CALCIUM 9.7 11/27/2018   ANIONGAP 13 11/02/2018   No results found for: CHOL No results found for: HDL No results found for: LDLCALC No results found for: TRIG No results found for: CHOLHDL No results found for: FHQR9X    Assessment & Plan:   Problem List Items Addressed This Visit   None   No orders of the defined types were placed in this encounter.   Assessment and Plan: 1. Radicular leg pain Continue current regimen.  Patient to present for labs. - gabapentin (NEURONTIN) 300 MG capsule; Take 2 capsules (600 mg total) by mouth 4 (four) times daily.  Dispense: 240 capsule; Refill: 5 - CBC with Differential/Platelet; Future - Comp. Metabolic Panel (12); Future  2. Vitamin D deficiency  - Vitamin D, 25-hydroxy; Future  3. Encounter for HCV screening test for low risk patient  - HCV Ab w Reflex to Quant PCR; Future  4.  History of COVID infection Patient encouraged to  continue supportive care.  Red flags given for prompt reevaluation  Follow Up Instructions:    I discussed the assessment and treatment plan with the patient. The patient was provided an opportunity to ask questions and all were answered. The patient agreed with the plan and demonstrated an understanding of the instructions.   The patient was advised to call back or seek an in-person evaluation if the symptoms worsen or if the condition fails to improve as anticipated.  I provided 21 minutes of non-face-to-face time during this encounter.   Kasandra Knudsen Mayers, PA-C

## 2021-05-30 ENCOUNTER — Other Ambulatory Visit: Payer: Self-pay

## 2021-05-30 ENCOUNTER — Other Ambulatory Visit: Payer: BC Managed Care – PPO

## 2021-05-30 DIAGNOSIS — Z1159 Encounter for screening for other viral diseases: Secondary | ICD-10-CM

## 2021-05-30 DIAGNOSIS — E559 Vitamin D deficiency, unspecified: Secondary | ICD-10-CM | POA: Diagnosis not present

## 2021-05-30 DIAGNOSIS — M5410 Radiculopathy, site unspecified: Secondary | ICD-10-CM | POA: Diagnosis not present

## 2021-05-30 NOTE — Progress Notes (Signed)
Labs completed

## 2021-05-31 LAB — COMP. METABOLIC PANEL (12)
AST: 30 IU/L (ref 0–40)
Albumin/Globulin Ratio: 1.7 (ref 1.2–2.2)
Albumin: 4.7 g/dL (ref 4.0–5.0)
Alkaline Phosphatase: 66 IU/L (ref 44–121)
BUN/Creatinine Ratio: 24 — ABNORMAL HIGH (ref 9–20)
BUN: 19 mg/dL (ref 6–20)
Bilirubin Total: 0.5 mg/dL (ref 0.0–1.2)
Calcium: 9.5 mg/dL (ref 8.7–10.2)
Chloride: 102 mmol/L (ref 96–106)
Creatinine, Ser: 0.8 mg/dL (ref 0.76–1.27)
Globulin, Total: 2.8 g/dL (ref 1.5–4.5)
Glucose: 90 mg/dL (ref 65–99)
Potassium: 4.2 mmol/L (ref 3.5–5.2)
Sodium: 140 mmol/L (ref 134–144)
Total Protein: 7.5 g/dL (ref 6.0–8.5)
eGFR: 121 mL/min/{1.73_m2} (ref >59)

## 2021-05-31 LAB — CBC WITH DIFFERENTIAL/PLATELET
Basophils Absolute: 0 10*3/uL (ref 0.0–0.2)
Basos: 1 %
EOS (ABSOLUTE): 0.4 10*3/uL (ref 0.0–0.4)
Eos: 7 %
Hematocrit: 40.8 % (ref 37.5–51.0)
Hemoglobin: 14 g/dL (ref 13.0–17.7)
Immature Grans (Abs): 0 10*3/uL (ref 0.0–0.1)
Immature Granulocytes: 0 %
Lymphocytes Absolute: 3.2 10*3/uL — ABNORMAL HIGH (ref 0.7–3.1)
Lymphs: 49 %
MCH: 29.9 pg (ref 26.6–33.0)
MCHC: 34.3 g/dL (ref 31.5–35.7)
MCV: 87 fL (ref 79–97)
Monocytes Absolute: 0.8 10*3/uL (ref 0.1–0.9)
Monocytes: 13 %
Neutrophils Absolute: 1.9 10*3/uL (ref 1.4–7.0)
Neutrophils: 30 %
Platelets: 219 10*3/uL (ref 150–450)
RBC: 4.69 x10E6/uL (ref 4.14–5.80)
RDW: 13.3 % (ref 11.6–15.4)
WBC: 6.3 10*3/uL (ref 3.4–10.8)

## 2021-05-31 LAB — HCV INTERPRETATION

## 2021-05-31 LAB — VITAMIN D 25 HYDROXY (VIT D DEFICIENCY, FRACTURES): Vit D, 25-Hydroxy: 50.7 ng/mL (ref 30.0–100.0)

## 2021-05-31 LAB — HCV AB W REFLEX TO QUANT PCR: HCV Ab: <0.1 s/co ratio (ref 0.0–0.9)

## 2021-06-01 ENCOUNTER — Telehealth: Payer: Self-pay | Admitting: *Deleted

## 2021-06-01 NOTE — Telephone Encounter (Signed)
-----   Message from Roney Jaffe, [place] sent at 05/31/2021 12:43 PM EDT ----- Please call patient and let him know that his kidney function and liver function are within normal limits, he does show signs of dehydration.  He does not show signs of anemia.  His vitamin D levels are within normal limits, and his screening for hepatitis C was negative.

## 2021-06-01 NOTE — Telephone Encounter (Signed)
Patient verified DOB Patient is aware of all labs being normal and screenings being negative. Patient was advised to increase water intake to a minimum 64 oz daily to address dehydration levels.

## 2021-08-13 IMAGING — DX DG SHOULDER 2+V*R*
4 series · 4 of 4 positions shown · non-contrast
Comparison: None.

CLINICAL DATA: Right arm and shoulder pain

EXAM:
RIGHT SHOULDER - 2+ VIEW

[shoulder neutral ap (1 of 2)]
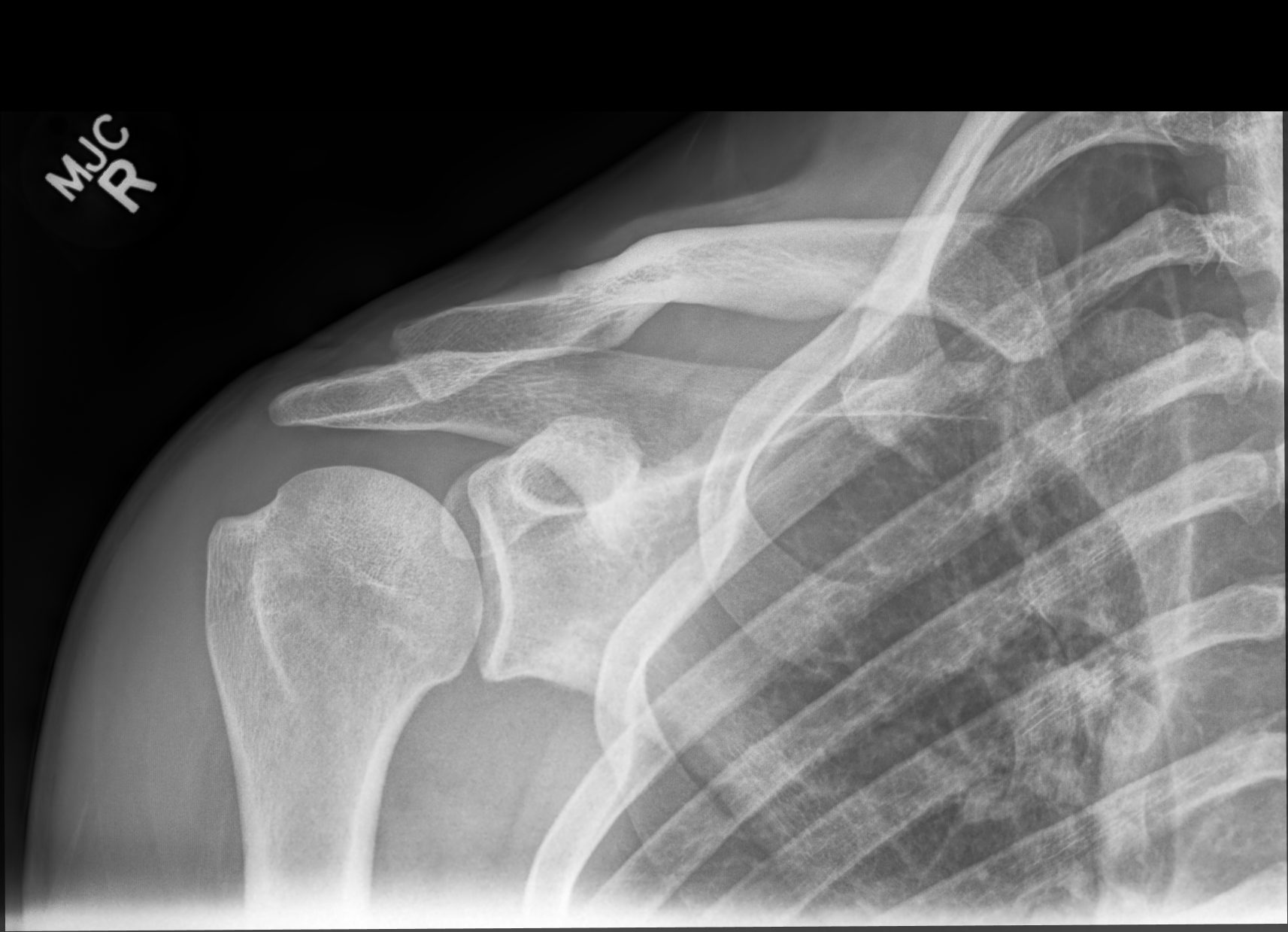

[shoulder neutral ap (2 of 2)]
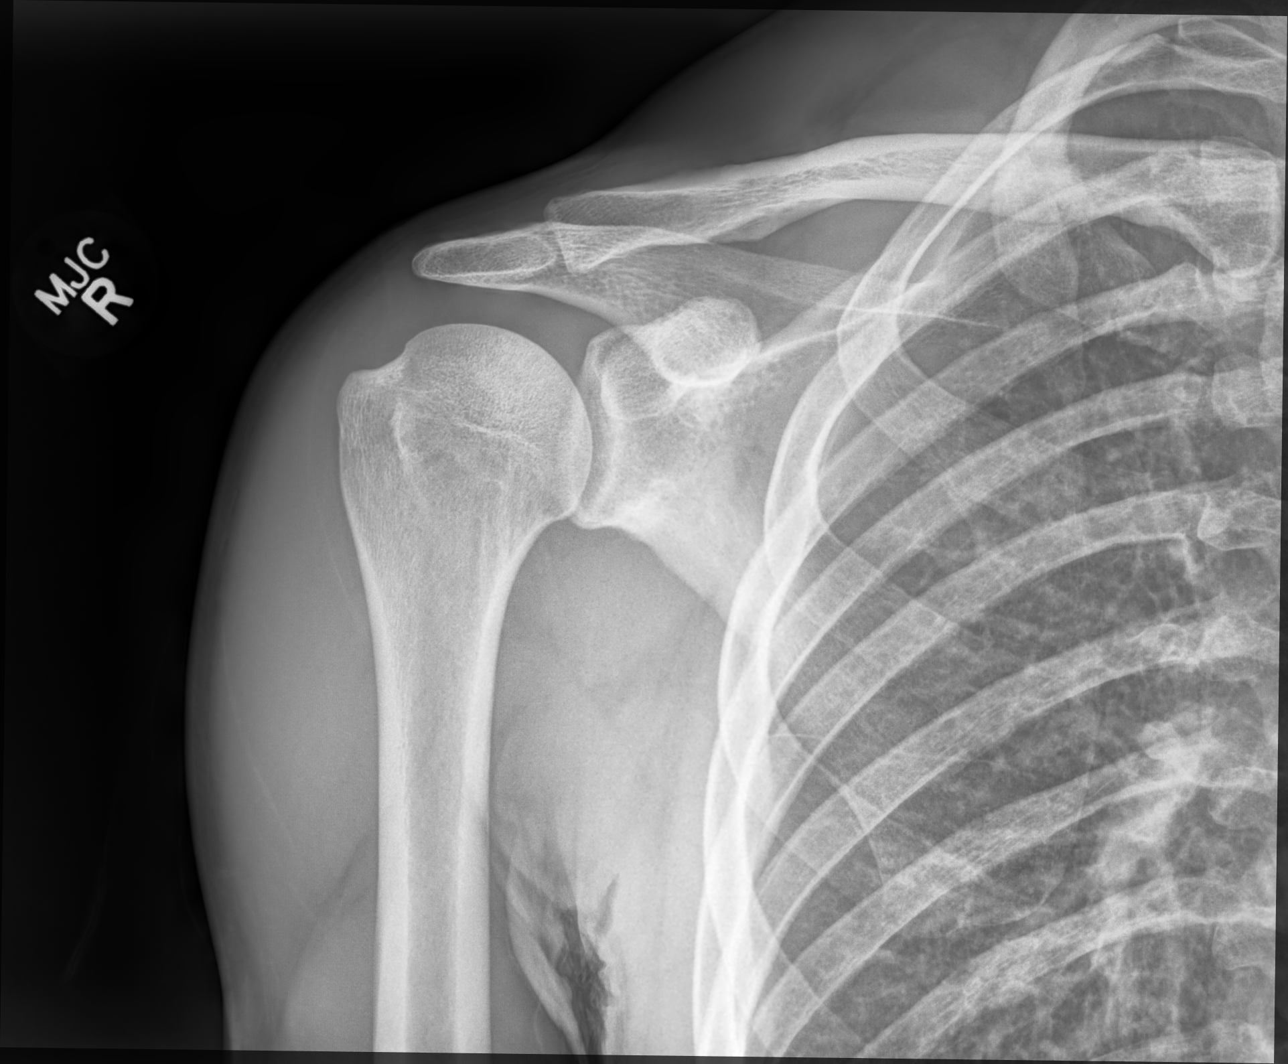

[shoulder transscapular y view (neer)]
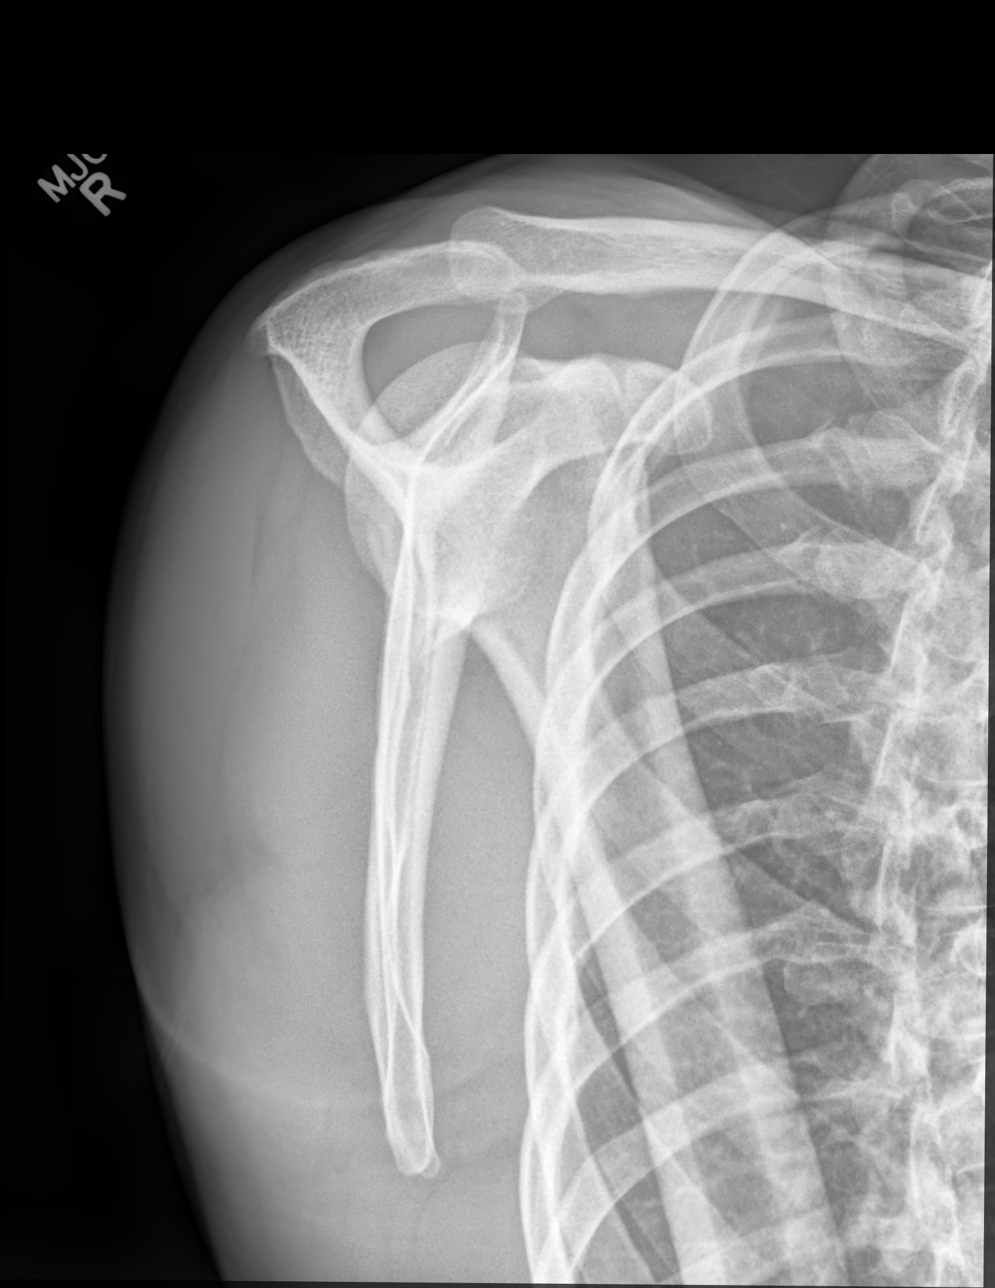

[shoulder axial 5° seated]
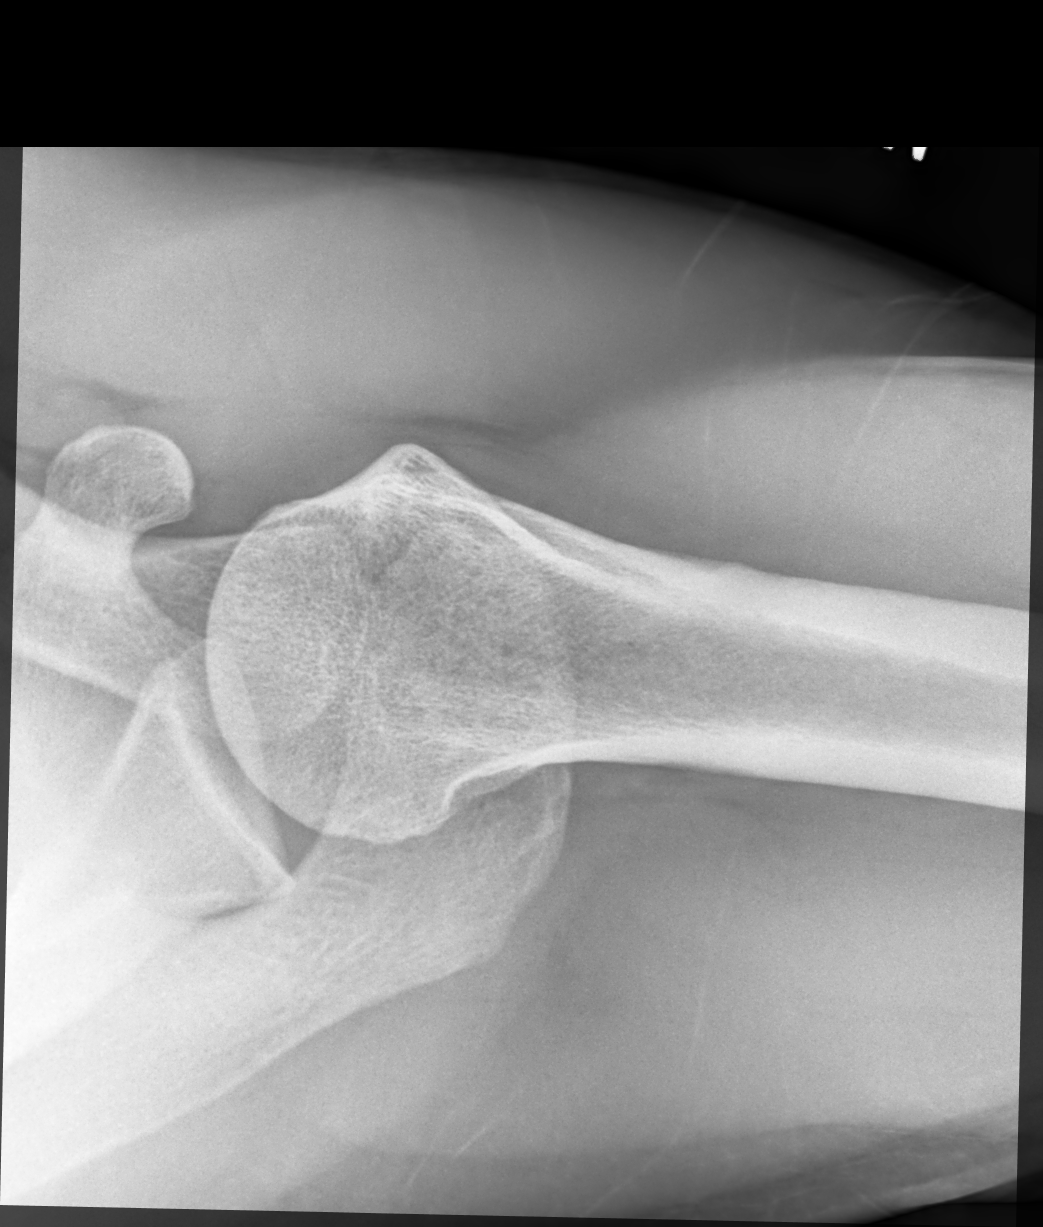

[4 of 4 positions shown; findings below may reference images not displayed]

FINDINGS: There is no evidence of fracture or dislocation. There is no
evidence of arthropathy or other focal bone abnormality. Soft
tissues are unremarkable.
IMPRESSION: Negative.

## 2021-08-13 IMAGING — DX DG CERVICAL SPINE COMPLETE 4+V
5 series · 5 of 5 positions shown · non-contrast
Comparison: None.

CLINICAL DATA: Neck pain for 3 weeks. Right upper extremity
weakness.

EXAM:
CERVICAL SPINE - COMPLETE 4+ VIEW

[cervical spine ap (1 of 4)]
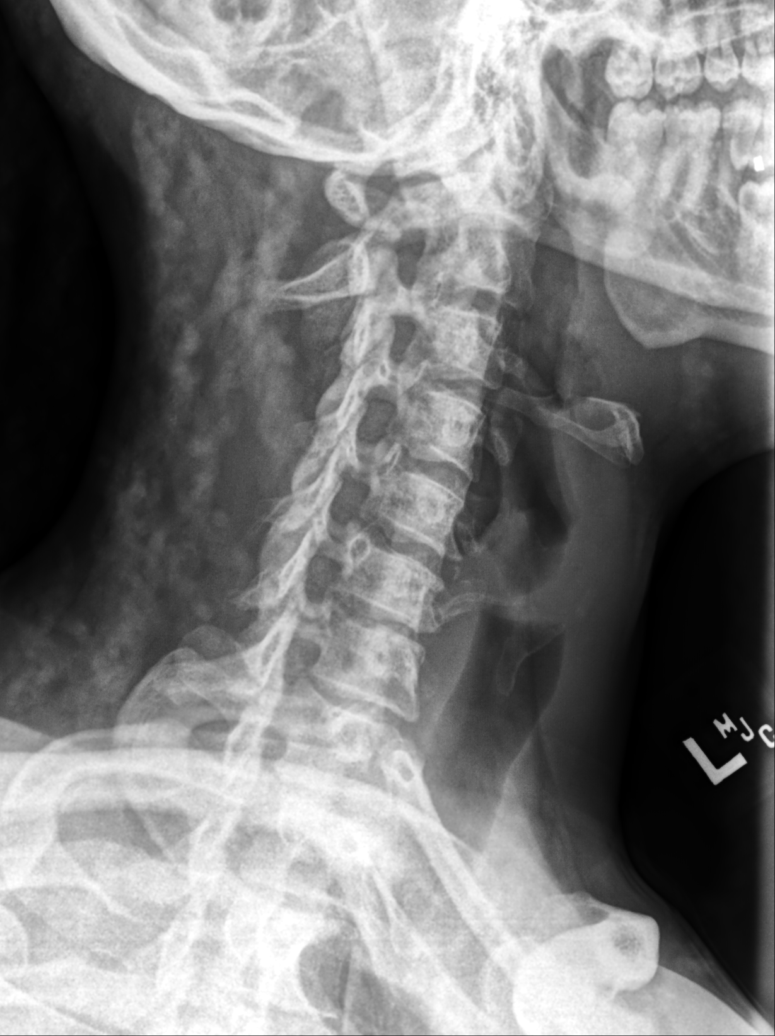

[cervical spine ap (2 of 4)]
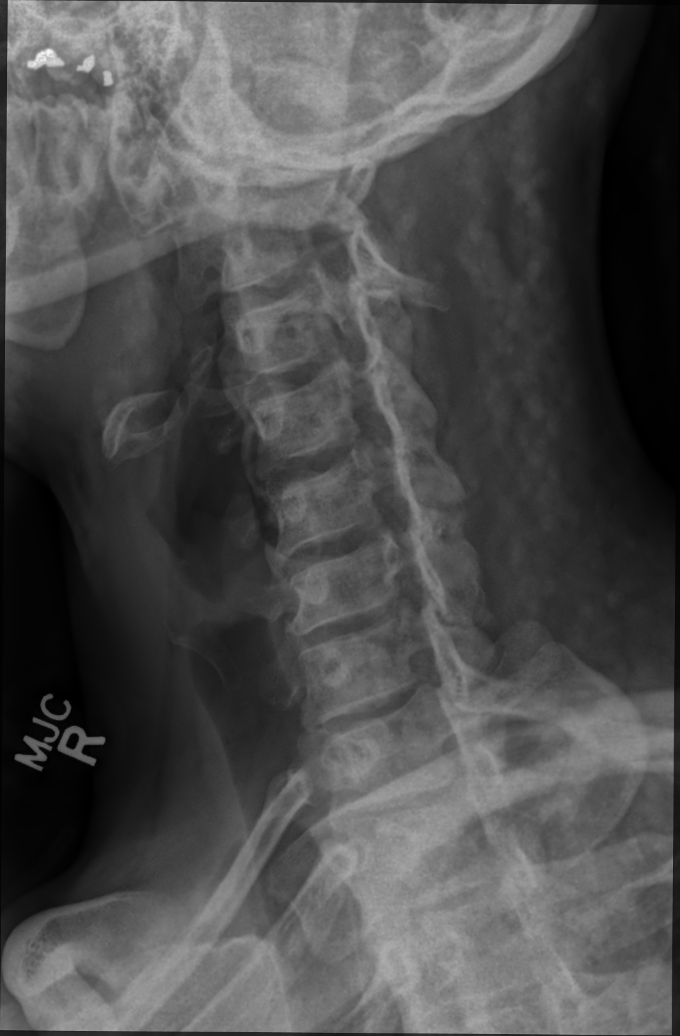

[cervical spine ap (3 of 4)]
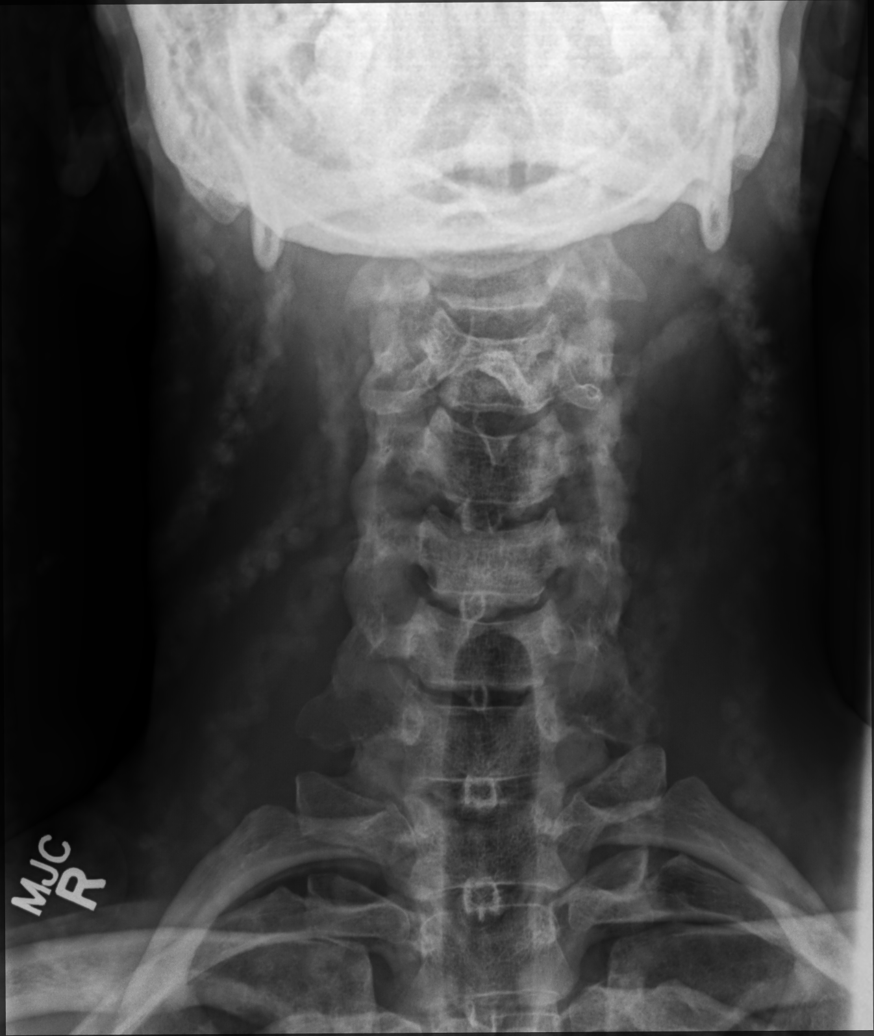

[cervical spine lat]
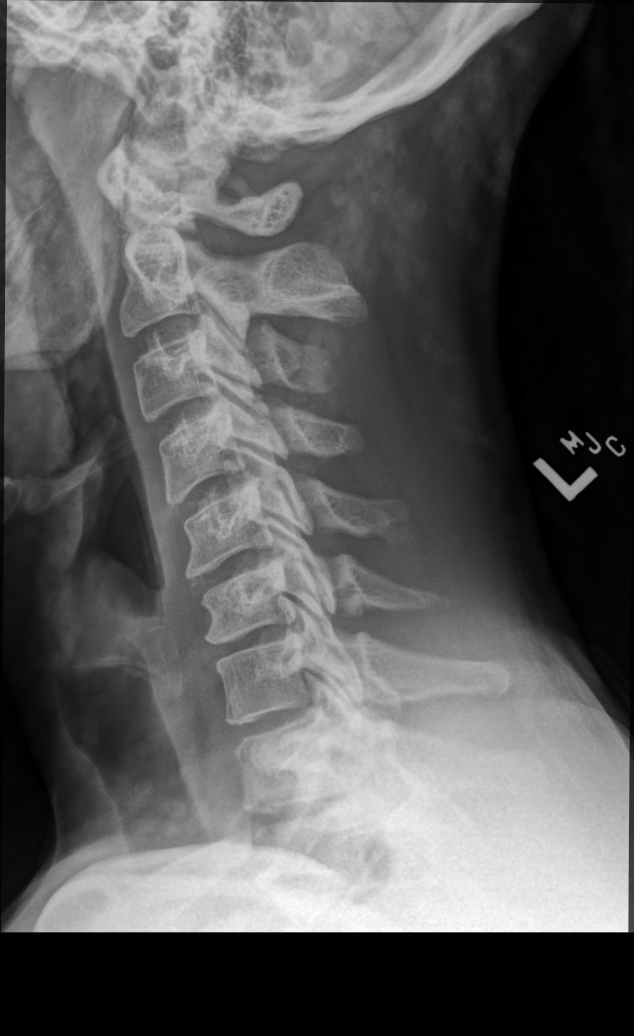

[cervical spine ap (4 of 4)]
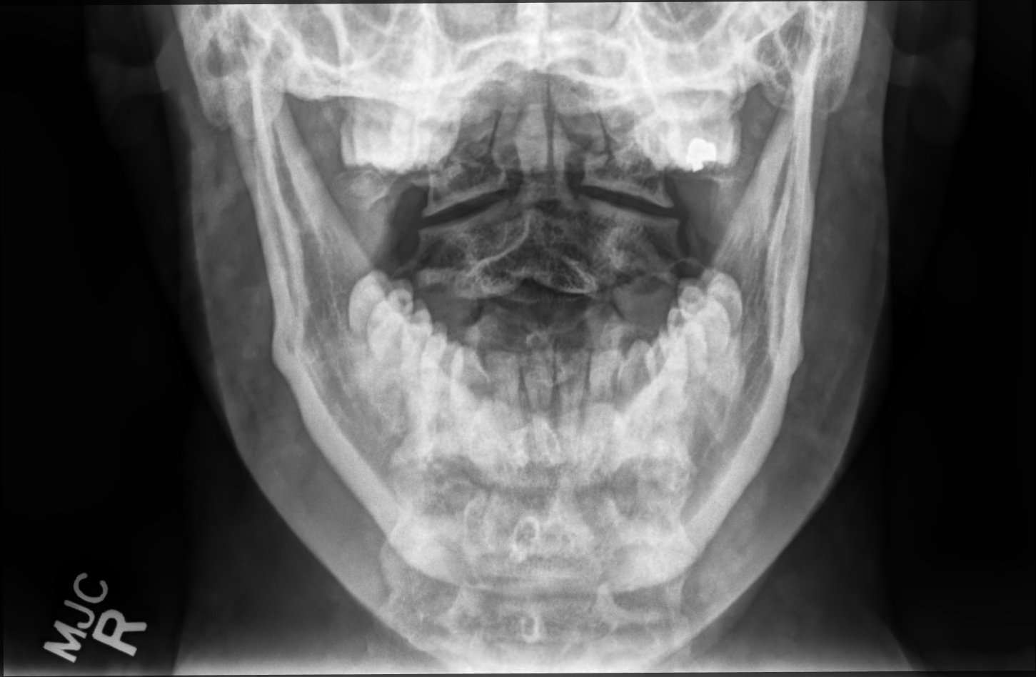

[5 of 5 positions shown; findings below may reference images not displayed]

FINDINGS: There is no evidence of cervical spine fracture or prevertebral soft
tissue swelling. Alignment is normal. No other significant bone
abnormalities are identified. Mild degenerative disc disease with
disc height loss at C6-7. Remainder the disc spaces are maintained.
Loss of normal cervical lordosis with straightening which may be
secondary to spasm. No foraminal stenosis.
IMPRESSION: No acute osseous injury of the cervical spine.

## 2021-10-13 ENCOUNTER — Other Ambulatory Visit: Payer: Self-pay

## 2021-10-20 ENCOUNTER — Other Ambulatory Visit: Payer: Self-pay

## 2021-11-01 ENCOUNTER — Other Ambulatory Visit: Payer: Self-pay

## 2021-11-04 ENCOUNTER — Other Ambulatory Visit: Payer: Self-pay

## 2021-11-07 ENCOUNTER — Other Ambulatory Visit: Payer: Self-pay

## 2022-09-22 ENCOUNTER — Ambulatory Visit: Payer: Self-pay | Admitting: *Deleted

## 2022-09-22 ENCOUNTER — Ambulatory Visit
Admission: RE | Admit: 2022-09-22 | Discharge: 2022-09-22 | Disposition: A | Discharge status: Home / Self Care | Payer: BC Managed Care – PPO | Source: Ambulatory Visit | Attending: Internal Medicine | Admitting: Internal Medicine

## 2022-09-22 ENCOUNTER — Ambulatory Visit: Payer: Self-pay
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
## 2022-09-22 NOTE — ED Triage Notes (Signed)
Pt reports having shooting pain to his left knee that shoots down left leg while stepping up stairs. The patient states he has also been having tingling to the leg.   Started: yesterday   Home interventions: none
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
## 2022-09-27 ENCOUNTER — Ambulatory Visit: Payer: BC Managed Care – PPO | Admitting: Physician Assistant

## 2023-06-26 ENCOUNTER — Other Ambulatory Visit: Payer: Self-pay

## 2023-06-26 ENCOUNTER — Ambulatory Visit (INDEPENDENT_AMBULATORY_CARE_PROVIDER_SITE_OTHER): Payer: BC Managed Care – PPO | Admitting: Family

## 2023-06-26 ENCOUNTER — Encounter: Payer: Self-pay | Admitting: Family

## 2023-06-26 VITALS — BP 112/69 | HR 59 | Temp 98.2°F | Ht 71.5 in | Wt 167.6 lb

## 2023-06-26 DIAGNOSIS — F32A Depression, unspecified: Secondary | ICD-10-CM

## 2023-06-26 MED ORDER — ESCITALOPRAM OXALATE 10 MG PO TABS
10.0000 mg | ORAL_TABLET | Freq: Every day | ORAL | 0 refills | Status: DC
  Filled 2023-06-26: qty 30, 30d supply, fill #0
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
## 2023-06-26 NOTE — Progress Notes (Signed)
Pt Mom and brother died and doesn't know what's going on.

## 2023-06-29 ENCOUNTER — Ambulatory Visit (INDEPENDENT_AMBULATORY_CARE_PROVIDER_SITE_OTHER): Payer: BC Managed Care – PPO | Admitting: Family

## 2023-06-29 VITALS — BP 112/66 | HR 79 | Temp 98.4°F | Ht 71.0 in | Wt 174.6 lb

## 2023-06-29 DIAGNOSIS — F32A Depression, unspecified: Secondary | ICD-10-CM | POA: Diagnosis not present

## 2023-06-29 DIAGNOSIS — Z0289 Encounter for other administrative examinations: Secondary | ICD-10-CM
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
## 2023-07-05 ENCOUNTER — Telehealth: Payer: Self-pay | Admitting: Family

## 2023-07-05 NOTE — Telephone Encounter (Signed)
Copied from CRM [id number]. Topic: General - Other >> Jul 04, 2023  9:58 AM Turkey B wrote: Reason for CRM: pt called in with the fax number for [employer] for [phone]. He says he needs this done asap.

## 2023-07-06 NOTE — Telephone Encounter (Signed)
Please update me once paperwork is located and I will review/advise at that time. Thank you.

## 2023-07-10 ENCOUNTER — Telehealth: Payer: Self-pay | Admitting: Family

## 2023-07-10 NOTE — Telephone Encounter (Signed)
Copied from CRM [id number]. Topic: General - Other >> Jul 10, 2023 11:02 AM Jacob Rogers wrote: Reason for CRM: The patient has called to follow up on previously requested documents for their employer  The patient shares that they had previously dropped off their short term disability paperwork and been in contact with their disability provider and told that the paperwork has not been submitted   Please contact further when possible

## 2023-07-10 NOTE — Telephone Encounter (Signed)
Patient is coming to pick up forms to give to [employer] for comepletion.

## 2023-07-13 ENCOUNTER — Telehealth: Payer: Self-pay | Admitting: Family

## 2023-07-13 NOTE — Telephone Encounter (Signed)
Patient dropped off document  [employer] , to be filled out by provider. Patient requested to send it back via Fax within 7-days. Document is located in providers tray at front office.Please advise at  [phone] the disability case manager

## 2023-07-16 NOTE — Telephone Encounter (Signed)
Paper work completed and left up front with [place]

## 2023-07-18 NOTE — Telephone Encounter (Signed)
Patient has picked up forms.

## 2023-08-01 ENCOUNTER — Other Ambulatory Visit: Payer: Self-pay

## 2023-08-01 ENCOUNTER — Ambulatory Visit (INDEPENDENT_AMBULATORY_CARE_PROVIDER_SITE_OTHER): Payer: BC Managed Care – PPO | Admitting: Family

## 2023-08-01 ENCOUNTER — Encounter: Payer: Self-pay | Admitting: Family

## 2023-08-01 VITALS — BP 128/71 | HR 72 | Temp 98.6°F | Ht 71.0 in | Wt 167.0 lb

## 2023-08-01 DIAGNOSIS — F32A Depression, unspecified: Secondary | ICD-10-CM | POA: Diagnosis not present

## 2023-08-01 DIAGNOSIS — F419 Anxiety disorder, unspecified: Secondary | ICD-10-CM | POA: Diagnosis not present

## 2023-08-01 MED ORDER — ESCITALOPRAM OXALATE 10 MG PO TABS
10.0000 mg | ORAL_TABLET | Freq: Every day | ORAL | 2 refills | Status: DC
  Filled 2023-08-01: qty 30, 30d supply, fill #0

## 2023-08-01 MED ORDER — HYDROXYZINE PAMOATE 25 MG PO CAPS
25.0000 mg | ORAL_CAPSULE | Freq: Three times a day (TID) | ORAL | 1 refills | Status: DC | PRN
  Filled 2023-08-01 – 2023-08-29 (×2): qty 30, 10d supply, fill #0

## 2023-08-01 NOTE — Progress Notes (Signed)
Patient states no concerns to speak about.   States not feeling any different on the medicine.   Declined flu shot.
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
## 2023-08-10 ENCOUNTER — Other Ambulatory Visit: Payer: Self-pay

## 2023-08-29 ENCOUNTER — Other Ambulatory Visit: Payer: Self-pay

## 2023-08-29 ENCOUNTER — Ambulatory Visit (INDEPENDENT_AMBULATORY_CARE_PROVIDER_SITE_OTHER): Payer: BC Managed Care – PPO | Admitting: Family

## 2023-08-29 VITALS — BP 121/76 | HR 72 | Temp 98.0°F | Ht 71.0 in | Wt 171.8 lb

## 2023-08-29 DIAGNOSIS — F32A Depression, unspecified: Secondary | ICD-10-CM | POA: Diagnosis not present

## 2023-08-29 DIAGNOSIS — F419 Anxiety disorder, unspecified: Secondary | ICD-10-CM | POA: Diagnosis not present

## 2023-08-29 NOTE — Progress Notes (Signed)
Patient states no concerns just that he hasn't picked up his medication.
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
## 2023-08-31 ENCOUNTER — Emergency Department (HOSPITAL_COMMUNITY): Payer: BC Managed Care – PPO

## 2023-08-31 ENCOUNTER — Other Ambulatory Visit: Payer: Self-pay

## 2023-08-31 ENCOUNTER — Encounter (HOSPITAL_COMMUNITY): Payer: Self-pay

## 2023-08-31 ENCOUNTER — Emergency Department (HOSPITAL_COMMUNITY)
Admission: EM | Admit: 2023-08-31 | Discharge: 2023-08-31 | Disposition: A | Discharge status: Home / Self Care | Payer: BC Managed Care – PPO | Attending: Emergency Medicine | Admitting: Emergency Medicine

## 2023-08-31 DIAGNOSIS — S0990XA Unspecified injury of head, initial encounter: Secondary | ICD-10-CM | POA: Insufficient documentation

## 2023-08-31 DIAGNOSIS — Y92410 Unspecified street and highway as the place of occurrence of the external cause: Secondary | ICD-10-CM | POA: Diagnosis not present

## 2023-08-31 DIAGNOSIS — S0083XA Contusion of other part of head, initial encounter: Secondary | ICD-10-CM | POA: Insufficient documentation

## 2023-08-31 DIAGNOSIS — S60414A Abrasion of right ring finger, initial encounter: Secondary | ICD-10-CM | POA: Insufficient documentation

## 2023-08-31 MED ORDER — IBUPROFEN 800 MG PO TABS
800.0000 mg | ORAL_TABLET | Freq: Once | ORAL | Status: AC
  Administered 2023-08-31: 800 mg via ORAL
  Filled 2023-08-31: qty 1
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
## 2023-09-26 ENCOUNTER — Ambulatory Visit (INDEPENDENT_AMBULATORY_CARE_PROVIDER_SITE_OTHER): Payer: BC Managed Care – PPO | Admitting: Family

## 2023-09-26 ENCOUNTER — Encounter: Payer: Self-pay | Admitting: Family

## 2023-09-26 ENCOUNTER — Other Ambulatory Visit: Payer: Self-pay

## 2023-09-26 VITALS — BP 125/77 | HR 76 | Temp 98.6°F | Ht 71.0 in | Wt 171.4 lb

## 2023-09-26 DIAGNOSIS — F32A Depression, unspecified: Secondary | ICD-10-CM | POA: Diagnosis not present

## 2023-09-26 DIAGNOSIS — F419 Anxiety disorder, unspecified: Secondary | ICD-10-CM | POA: Diagnosis not present

## 2023-09-26 MED ORDER — ESCITALOPRAM OXALATE 10 MG PO TABS
10.0000 mg | ORAL_TABLET | Freq: Every day | ORAL | 2 refills | Status: DC
  Filled 2023-09-26: qty 30, 30d supply, fill #0

## 2023-09-26 MED ORDER — HYDROXYZINE PAMOATE 50 MG PO CAPS
50.0000 mg | ORAL_CAPSULE | Freq: Three times a day (TID) | ORAL | 1 refills | Status: DC | PRN
  Filled 2023-09-26: qty 30, 10d supply, fill #0
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
## 2023-09-26 NOTE — Progress Notes (Signed)
Patient states no concerns to discuss

## 2023-09-27 ENCOUNTER — Other Ambulatory Visit: Payer: Self-pay

## 2023-09-30 ENCOUNTER — Emergency Department (HOSPITAL_COMMUNITY)
Admission: EM | Admit: 2023-09-30 | Discharge: 2023-09-30 | Disposition: A | Discharge status: Home / Self Care | Payer: BC Managed Care – PPO | Attending: Emergency Medicine | Admitting: Emergency Medicine

## 2023-09-30 ENCOUNTER — Other Ambulatory Visit: Payer: Self-pay

## 2023-09-30 ENCOUNTER — Encounter (HOSPITAL_COMMUNITY): Payer: Self-pay

## 2023-09-30 ENCOUNTER — Emergency Department (HOSPITAL_COMMUNITY): Payer: BC Managed Care – PPO

## 2023-09-30 DIAGNOSIS — S8391XA Sprain of unspecified site of right knee, initial encounter: Secondary | ICD-10-CM | POA: Insufficient documentation

## 2023-09-30 DIAGNOSIS — M25561 Pain in right knee: Secondary | ICD-10-CM | POA: Diagnosis not present

## 2023-09-30 DIAGNOSIS — X509XXA Other and unspecified overexertion or strenuous movements or postures, initial encounter: Secondary | ICD-10-CM | POA: Insufficient documentation

## 2023-09-30 MED ORDER — IBUPROFEN 600 MG PO TABS
600.0000 mg | ORAL_TABLET | Freq: Three times a day (TID) | ORAL | 0 refills | Status: AC | PRN
  Filled 2023-10-01: qty 21, 7d supply, fill #0

## 2023-09-30 MED ORDER — IBUPROFEN 400 MG PO TABS
600.0000 mg | ORAL_TABLET | Freq: Once | ORAL | Status: AC
  Administered 2023-09-30: 600 mg via ORAL
  Filled 2023-09-30: qty 1

## 2023-09-30 NOTE — Discharge Instructions (Addendum)
The ibuprofen to help with your pain.  Follow-up with an orthopedic doctor to be rechecked if the symptoms do not improve.  Use your crutches and a knee sleeve as needed.
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
## 2023-10-01 ENCOUNTER — Other Ambulatory Visit (HOSPITAL_BASED_OUTPATIENT_CLINIC_OR_DEPARTMENT_OTHER): Payer: Self-pay

## 2023-10-01 ENCOUNTER — Ambulatory Visit (INDEPENDENT_AMBULATORY_CARE_PROVIDER_SITE_OTHER): Payer: BC Managed Care – PPO | Admitting: Student

## 2023-10-01 ENCOUNTER — Encounter (HOSPITAL_BASED_OUTPATIENT_CLINIC_OR_DEPARTMENT_OTHER): Payer: Self-pay | Admitting: Student

## 2023-10-01 DIAGNOSIS — S82141A Displaced bicondylar fracture of right tibia, initial encounter for closed fracture: Secondary | ICD-10-CM | POA: Diagnosis not present

## 2023-10-01 MED ORDER — TRAMADOL HCL 50 MG PO TABS
50.0000 mg | ORAL_TABLET | Freq: Four times a day (QID) | ORAL | 0 refills | Status: AC | PRN
  Filled 2023-10-01: qty 20, 5d supply, fill #0
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
## 2023-10-02 ENCOUNTER — Telehealth (HOSPITAL_BASED_OUTPATIENT_CLINIC_OR_DEPARTMENT_OTHER): Payer: Self-pay | Admitting: Student

## 2023-10-02 ENCOUNTER — Other Ambulatory Visit: Payer: Self-pay

## 2023-10-02 ENCOUNTER — Encounter (HOSPITAL_BASED_OUTPATIENT_CLINIC_OR_DEPARTMENT_OTHER): Payer: Self-pay

## 2023-10-02 NOTE — Telephone Encounter (Signed)
Patient states he was given a work note yesterday for light duty but his job needs specifics on what he can do or he can not work. Please upload to his mychart

## 2023-10-02 NOTE — Telephone Encounter (Signed)
Work note sent to [employer]

## 2023-10-02 NOTE — Telephone Encounter (Signed)
Please advise 

## 2023-10-23 ENCOUNTER — Encounter (HOSPITAL_BASED_OUTPATIENT_CLINIC_OR_DEPARTMENT_OTHER): Payer: Self-pay | Admitting: Student

## 2023-10-23 ENCOUNTER — Ambulatory Visit (HOSPITAL_BASED_OUTPATIENT_CLINIC_OR_DEPARTMENT_OTHER): Payer: BC Managed Care – PPO

## 2023-10-23 ENCOUNTER — Other Ambulatory Visit (HOSPITAL_BASED_OUTPATIENT_CLINIC_OR_DEPARTMENT_OTHER): Payer: Self-pay

## 2023-10-23 ENCOUNTER — Ambulatory Visit (HOSPITAL_BASED_OUTPATIENT_CLINIC_OR_DEPARTMENT_OTHER): Payer: BC Managed Care – PPO | Admitting: Student

## 2023-10-23 DIAGNOSIS — S82141A Displaced bicondylar fracture of right tibia, initial encounter for closed fracture: Secondary | ICD-10-CM

## 2023-10-23 DIAGNOSIS — M7989 Other specified soft tissue disorders: Secondary | ICD-10-CM | POA: Diagnosis not present

## 2023-10-23 DIAGNOSIS — M25561 Pain in right knee: Secondary | ICD-10-CM | POA: Diagnosis not present

## 2023-10-23 MED ORDER — IBUPROFEN 800 MG PO TABS
800.0000 mg | ORAL_TABLET | Freq: Three times a day (TID) | ORAL | 0 refills | Status: AC
  Filled 2023-10-23: qty 30, 10d supply, fill #0
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
## 2023-10-24 ENCOUNTER — Encounter: Payer: Self-pay | Admitting: Family

## 2023-10-24 ENCOUNTER — Ambulatory Visit (INDEPENDENT_AMBULATORY_CARE_PROVIDER_SITE_OTHER): Payer: BC Managed Care – PPO | Admitting: Family

## 2023-10-24 VITALS — BP 134/79 | HR 70 | Temp 98.4°F | Ht 70.5 in | Wt 176.4 lb

## 2023-10-24 DIAGNOSIS — F419 Anxiety disorder, unspecified: Secondary | ICD-10-CM | POA: Diagnosis not present

## 2023-10-24 DIAGNOSIS — F32A Depression, unspecified: Secondary | ICD-10-CM | POA: Diagnosis not present
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
## 2023-10-24 NOTE — Progress Notes (Signed)
Patient states he's not sure if the medicine is working or not.

## 2023-12-14 ENCOUNTER — Ambulatory Visit (HOSPITAL_BASED_OUTPATIENT_CLINIC_OR_DEPARTMENT_OTHER): Payer: BC Managed Care – PPO | Admitting: Student

## 2023-12-14 ENCOUNTER — Ambulatory Visit (HOSPITAL_BASED_OUTPATIENT_CLINIC_OR_DEPARTMENT_OTHER): Payer: BC Managed Care – PPO

## 2023-12-14 ENCOUNTER — Encounter (HOSPITAL_BASED_OUTPATIENT_CLINIC_OR_DEPARTMENT_OTHER): Payer: Self-pay | Admitting: Student

## 2023-12-14 DIAGNOSIS — S82141A Displaced bicondylar fracture of right tibia, initial encounter for closed fracture: Secondary | ICD-10-CM

## 2023-12-14 DIAGNOSIS — M25561 Pain in right knee: Secondary | ICD-10-CM | POA: Diagnosis not present

## 2023-12-14 NOTE — Progress Notes (Signed)
 Chief Complaint: Right knee pain     History of Present Illness:   12/14/23: Jacob Rogers is here today for further evaluation and discussion of his right knee.  He reports that since his injury in late November 2024 he has continued to have consistent pain.  Reports that this pain is different than before and is located mainly in the posterolateral knee.  This is worse when his knee is in flexion or extension for long periods of time.  He does also note occasional buckling and locking.  He has been wearing a knee brace.  Has used Tylenol  and IcyHot for symptom relief.   11/02/23: Patient is here today for follow-up of his right knee.  He reports doing some better however continues to have pain with particular activities such as climbing stairs and standing for extended periods of time.  Pain level at its worst is an 8/10.  He does note some increased popping in the knee particular with stairs.  He has been able to put more weight through the knee although is still walking with a limp.  Tramadol  has helped but has recently began to transition to ibuprofen .  Surgical History:   None  PMH/PSH/Family History/Social History/Meds/Allergies:    Past Medical History:  Diagnosis Date   Depression    Paranoid schizophrenia (HCC)    Past Surgical History:  Procedure Laterality Date   FRACTURE SURGERY     right foot   I & D EXTREMITY Left 11/03/2018   Procedure: IRRIGATION AND DEBRIDEMENT, OPEN FRACTURE;  Surgeon: Elsa Lonni SAUNDERS, MD;  Location: [facility] OR;  Service: Orthopedics;  Laterality: Left;   INTRAMEDULLARY (IM) NAIL INTERTROCHANTERIC Left 11/03/2018   Procedure: INTRAMEDULLARY (IM) NAIL, LEFT FEMUR;  Surgeon: Elsa Lonni SAUNDERS, MD;  Location: [facility] OR;  Service: Orthopedics;  Laterality: Left;   Social History   Socioeconomic History   Marital status: Single    Spouse name: Not on file   Number of children: Not on file   Years of education: Not on  file   Highest education level: GED or equivalent  Occupational History   Not on file  Tobacco Use   Smoking status: Never   Smokeless tobacco: Never  Vaping Use   Vaping status: Never Used  Substance and Sexual Activity   Alcohol use: Yes   Drug use: Not Currently   Sexual activity: Not on file  Other Topics Concern   Not on file  Social History Narrative   ** Merged History Encounter **       Social Drivers of Health   Financial Resource Strain: Medium Risk (10/22/2023)   Overall Financial Resource Strain (CARDIA)    Difficulty of Paying Living Expenses: Somewhat hard  Food Insecurity: Food Insecurity Present (10/22/2023)   Hunger Vital Sign    Worried About Running Out of Food in the Last Year: Sometimes true    Ran Out of Food in the Last Year: Sometimes true  Transportation Needs: No Transportation Needs (10/22/2023)   PRAPARE - Administrator, Civil Service (Medical): No    Lack of Transportation (Non-Medical): No  Physical Activity: Insufficiently Active (10/22/2023)   Exercise Vital Sign    Days of Exercise per Week: 4 days    Minutes of Exercise per Session: 30 min  Stress: Stress Concern Present (10/22/2023)  [employer] of Occupational Health - Occupational Stress Questionnaire    Feeling of Stress : Very much  Social Connections: Socially Integrated (10/22/2023)   Social Connection and Isolation Panel [NHANES]    Frequency of Communication with Friends and Family: Three times a week    Frequency of Social Gatherings with Friends and Family: Once a week    Attends Religious Services: 1 to 4 times per year    Active Member of [employer] or Organizations: Yes    Attends Engineer, Structural: More than 4 times per year    Marital Status: Living with partner   Family History  Problem Relation Age of Onset   Cancer Maternal Grandmother    Diabetes Neg Hx    Hypertension Neg Hx    No Known Allergies Current Outpatient Medications   Medication Sig Dispense Refill   escitalopram  (LEXAPRO ) 10 MG tablet Take 1 tablet (10 mg total) by mouth at bedtime. 30 tablet 2   hydrOXYzine  (VISTARIL ) 50 MG capsule Take 1 capsule (50 mg total) by mouth every 8 (eight) hours as needed. 30 capsule 1   No current facility-administered medications for this visit.   No results found.  Review of Systems:   A ROS was performed including pertinent positives and negatives as documented in the HPI.  Physical Exam :   Constitutional: NAD and appears stated age Neurological: Alert and oriented Psych: Appropriate affect and cooperative There were no vitals taken for this visit.   Comprehensive Musculoskeletal Exam:    Active range of motion of the right knee from 0 to 120 degrees without crepitus.  Positive bilateral joint line tenderness, worse laterally.  No laxity with varus or valgus stress.  Negative Lachman.  Positive McMurray.  Knee strength 5/5 with flexion and extension.  Imaging:   Xray (right knee 4 views): Negative for bony abnormality   I personally reviewed and interpreted the radiographs.   Assessment:   38 y.o. male with over 78-month history of right knee pain.  X-rays after this injury did suggest a tibial plateau fracture which has improved however he does continue to have persistent pain.  Based on exam today I do have some concern for an underlying meniscal injury and therefore would like to proceed with an MRI for further assessment.  Will have him continue the knee brace with weightbearing as tolerated.  Follow-up shortly after for review.  Plan :    -Obtain MRI of the right knee and return to clinic for review and treatment discussion     I personally saw and evaluated the patient, and participated in the management and treatment plan.  Leonce Reveal, PA-C Orthopedics

## 2023-12-17 ENCOUNTER — Other Ambulatory Visit: Payer: Self-pay | Admitting: Student

## 2023-12-17 DIAGNOSIS — S82141A Displaced bicondylar fracture of right tibia, initial encounter for closed fracture: Secondary | ICD-10-CM

## 2023-12-18 ENCOUNTER — Encounter (HOSPITAL_BASED_OUTPATIENT_CLINIC_OR_DEPARTMENT_OTHER): Payer: Self-pay | Admitting: Student

## 2023-12-28 ENCOUNTER — Ambulatory Visit
Admission: RE | Admit: 2023-12-28 | Discharge: 2023-12-28 | Disposition: A | Discharge status: Home / Self Care | Payer: BC Managed Care – PPO | Source: Ambulatory Visit | Attending: Student | Admitting: Student

## 2023-12-28 DIAGNOSIS — S82141A Displaced bicondylar fracture of right tibia, initial encounter for closed fracture: Secondary | ICD-10-CM

## 2023-12-28 DIAGNOSIS — Z0189 Encounter for other specified special examinations: Secondary | ICD-10-CM | POA: Diagnosis not present

## 2023-12-28 DIAGNOSIS — M25561 Pain in right knee: Secondary | ICD-10-CM | POA: Diagnosis not present

## 2024-01-03 ENCOUNTER — Encounter (HOSPITAL_BASED_OUTPATIENT_CLINIC_OR_DEPARTMENT_OTHER): Payer: Self-pay | Admitting: Student

## 2024-01-03 ENCOUNTER — Ambulatory Visit (HOSPITAL_BASED_OUTPATIENT_CLINIC_OR_DEPARTMENT_OTHER): Payer: BC Managed Care – PPO | Admitting: Student

## 2024-01-03 DIAGNOSIS — M25561 Pain in right knee: Secondary | ICD-10-CM

## 2024-01-03 DIAGNOSIS — G8929 Other chronic pain: Secondary | ICD-10-CM

## 2024-01-03 NOTE — Progress Notes (Signed)
 Chief Complaint: Right knee pain     History of Present Illness:   01/03/24: Patient presents today for MRI review of the right knee.  He reports little to no improvement in his symptoms.  Does continue to have some posterior knee pain although also reports inability to perform deep flexion and notes popping sounds while going up or down stairs.  Denies taking any pain medication.  He has been using a brace for added stability.   12/14/2023: Jacob Rogers is here today for further evaluation and discussion of his right knee.  He reports that since his injury in late November 2024 he has continued to have consistent pain.  Reports that this pain is different than before and is located mainly in the posterolateral knee.  This is worse when his knee is in flexion or extension for long periods of time.  He does also note occasional buckling and locking.  He has been wearing a knee brace.  Has used Tylenol and IcyHot for symptom relief.  Surgical History:   None  PMH/PSH/Family History/Social History/Meds/Allergies:    Past Medical History:  Diagnosis Date   Depression    Paranoid schizophrenia (HCC)    Past Surgical History:  Procedure Laterality Date   FRACTURE SURGERY     right foot   I & D EXTREMITY Left 11/03/2018   Procedure: IRRIGATION AND DEBRIDEMENT, OPEN FRACTURE;  Surgeon: Terance Hart, MD;  Location: [facility] OR;  Service: Orthopedics;  Laterality: Left;   INTRAMEDULLARY (IM) NAIL INTERTROCHANTERIC Left 11/03/2018   Procedure: INTRAMEDULLARY (IM) NAIL, LEFT FEMUR;  Surgeon: Terance Hart, MD;  Location: [facility] OR;  Service: Orthopedics;  Laterality: Left;   Social History   Socioeconomic History   Marital status: Single    Spouse name: Not on file   Number of children: Not on file   Years of education: Not on file   Highest education level: GED or equivalent  Occupational History   Not on file  Tobacco Use   Smoking status: Never    Smokeless tobacco: Never  Vaping Use   Vaping status: Never Used  Substance and Sexual Activity   Alcohol use: Yes   Drug use: Not Currently   Sexual activity: Not on file  Other Topics Concern   Not on file  Social History Narrative   ** Merged History Encounter **       Social Drivers of Health   Financial Resource Strain: Medium Risk (10/22/2023)   Overall Financial Resource Strain (CARDIA)    Difficulty of Paying Living Expenses: Somewhat hard  Food Insecurity: Food Insecurity Present (10/22/2023)   Hunger Vital Sign    Worried About Running Out of Food in the Last Year: Sometimes true    Ran Out of Food in the Last Year: Sometimes true  Transportation Needs: No Transportation Needs (10/22/2023)   PRAPARE - Administrator, Civil Service (Medical): No    Lack of Transportation (Non-Medical): No  Physical Activity: Insufficiently Active (10/22/2023)   Exercise Vital Sign    Days of Exercise per Week: 4 days    Minutes of Exercise per Session: 30 min  Stress: Stress Concern Present (10/22/2023)   [employer] of Occupational Health - Occupational Stress Questionnaire    Feeling of Stress : Very much  Social Connections: Socially Integrated (  10/22/2023)   Social Connection and Isolation Panel [NHANES]    Frequency of Communication with Friends and Family: Three times a week    Frequency of Social Gatherings with Friends and Family: Once a week    Attends Religious Services: 1 to 4 times per year    Active Member of [employer] or Organizations: Yes    Attends Engineer, structural: More than 4 times per year    Marital Status: Living with partner   Family History  Problem Relation Age of Onset   Cancer Maternal Grandmother    Diabetes Neg Hx    Hypertension Neg Hx    No Known Allergies Current Outpatient Medications  Medication Sig Dispense Refill   escitalopram (LEXAPRO) 10 MG tablet Take 1 tablet (10 mg total) by mouth at bedtime. 30 tablet 2    hydrOXYzine (VISTARIL) 50 MG capsule Take 1 capsule (50 mg total) by mouth every 8 (eight) hours as needed. 30 capsule 1   No current facility-administered medications for this visit.   No results found.  Review of Systems:   A ROS was performed including pertinent positives and negatives as documented in the HPI.  Physical Exam :   Constitutional: NAD and appears stated age Neurological: Alert and oriented Psych: Appropriate affect and cooperative There were no vitals taken for this visit.   Comprehensive Musculoskeletal Exam:    Active range of motion of the right knee from 0 to 120 degrees without crepitus.  Positive bilateral joint line tenderness, worse laterally.  No laxity with varus or valgus stress.  Knee strength 5/5 with flexion and extension.  Imaging:   MRI right knee: Chondral loss within the patellofemoral compartment.  Prior healed medial tibial plateau fracture.   I personally reviewed and interpreted the radiographs.   Assessment:   38 y.o. male with continued pain in the right knee as a result of an injury sustained over 3 months ago.  Review of MRI today does show a chondral defect within the patellofemoral compartment which is consistent with his symptoms while going up or down stairs and in a flexed position.  Overall his symptoms have shown very little improvement over the past 2 months and therefore I would like for him to be seen by Dr. Steward Drone to discuss further treatment options which could include knee arthroscopy and/or MACI procedure if he is a candidate.  Patient is very active and would like to maintain this.  Will schedule a follow-up with Dr. Steward Drone within the next 1 to 2 weeks.  Plan :    -Follow up with Dr. Steward Drone for further treatment discussion     I personally saw and evaluated the patient, and participated in the management and treatment plan.  Hazle Nordmann, PA-C Orthopedics

## 2024-01-10 ENCOUNTER — Encounter: Payer: Self-pay | Admitting: Family

## 2024-01-11 ENCOUNTER — Ambulatory Visit (HOSPITAL_BASED_OUTPATIENT_CLINIC_OR_DEPARTMENT_OTHER): Payer: BC Managed Care – PPO | Admitting: Orthopaedic Surgery

## 2024-01-11 ENCOUNTER — Ambulatory Visit (HOSPITAL_BASED_OUTPATIENT_CLINIC_OR_DEPARTMENT_OTHER): Payer: Self-pay | Admitting: Orthopaedic Surgery

## 2024-01-11 ENCOUNTER — Other Ambulatory Visit (HOSPITAL_BASED_OUTPATIENT_CLINIC_OR_DEPARTMENT_OTHER): Payer: Self-pay

## 2024-01-11 DIAGNOSIS — G8929 Other chronic pain: Secondary | ICD-10-CM | POA: Diagnosis not present

## 2024-01-11 DIAGNOSIS — M238X1 Other internal derangements of right knee: Secondary | ICD-10-CM

## 2024-01-11 DIAGNOSIS — M25561 Pain in right knee: Secondary | ICD-10-CM | POA: Diagnosis not present

## 2024-01-11 MED ORDER — IBUPROFEN 800 MG PO TABS
800.0000 mg | ORAL_TABLET | Freq: Three times a day (TID) | ORAL | 0 refills | Status: AC
  Filled 2024-01-11: qty 30, 10d supply, fill #0

## 2024-01-11 MED ORDER — ACETAMINOPHEN 500 MG PO TABS
500.0000 mg | ORAL_TABLET | Freq: Three times a day (TID) | ORAL | 0 refills | Status: AC
  Filled 2024-01-11: qty 30, 10d supply, fill #0

## 2024-01-11 MED ORDER — OXYCODONE HCL 5 MG PO TABS
5.0000 mg | ORAL_TABLET | ORAL | 0 refills | Status: DC | PRN
Start: 2024-01-11 — End: 2024-02-25
  Filled 2024-01-11: qty 5, 1d supply, fill #0

## 2024-01-11 NOTE — Progress Notes (Signed)
 Chief Complaint: Right knee pain        History of Present Illness:    01/11/2024:  Patient presents today for MRI review of the right knee.    12/14/2023: Jacob Rogers is here today for further evaluation and discussion of his right knee.  He reports that since his injury in late November 2024 he has continued to have consistent pain.  Reports that this pain is different than before and is located mainly in the posterolateral knee.  This is worse when his knee is in flexion or extension for long periods of time.  He does also note occasional buckling and locking.  He has been wearing a knee brace.  Has used Tylenol and IcyHot for symptom relief.   Surgical History:   None   PMH/PSH/Family History/Social History/Meds/Allergies:         Past Medical History:  Diagnosis Date   Depression     Paranoid schizophrenia (HCC)               Past Surgical History:  Procedure Laterality Date   FRACTURE SURGERY        right foot   I & D EXTREMITY Left 11/03/2018    Procedure: IRRIGATION AND DEBRIDEMENT, OPEN FRACTURE;  Surgeon: Terance Hart, MD;  Location: [facility] OR;  Service: Orthopedics;  Laterality: Left;   INTRAMEDULLARY (IM) NAIL INTERTROCHANTERIC Left 11/03/2018    Procedure: INTRAMEDULLARY (IM) NAIL, LEFT FEMUR;  Surgeon: Terance Hart, MD;  Location: [facility] OR;  Service: Orthopedics;  Laterality: Left;        Social History         Socioeconomic History   Marital status: Single      Spouse name: Not on file   Number of children: Not on file   Years of education: Not on file   Highest education level: GED or equivalent  Occupational History   Not on file  Tobacco Use   Smoking status: Never   Smokeless tobacco: Never  Vaping Use   Vaping status: Never Used  Substance and Sexual Activity   Alcohol use: Yes   Drug use: Not Currently   Sexual activity: Not on file  Other Topics Concern   Not on file  Social History Narrative    **  Merged History Encounter **         Social Drivers of Health        Financial Resource Strain: Medium Risk (10/22/2023)    Overall Financial Resource Strain (CARDIA)     Difficulty of Paying Living Expenses: Somewhat hard  Food Insecurity: Food Insecurity Present (10/22/2023)    Hunger Vital Sign     Worried About Running Out of Food in the Last Year: Sometimes true     Ran Out of Food in the Last Year: Sometimes true  Transportation Needs: No Transportation Needs (10/22/2023)    PRAPARE - Therapist, art (Medical): No     Lack of Transportation (Non-Medical): No  Physical Activity: Insufficiently Active (10/22/2023)    Exercise Vital Sign     Days of Exercise per Week: 4 days     Minutes of Exercise per Session: 30 min  Stress: Stress Concern Present (10/22/2023)    [employer] of Occupational Health - Occupational Stress Questionnaire     Feeling of Stress : Very much  Social Connections: Socially Integrated (10/22/2023)    Social Connection and Isolation Panel [NHANES]     Frequency of  Communication with Friends and Family: Three times a week     Frequency of Social Gatherings with Friends and Family: Once a week     Attends Religious Services: 1 to 4 times per year     Active Member of [employer] or Organizations: Yes     Attends Engineer, structural: More than 4 times per year     Marital Status: Living with partner         Family History  Problem Relation Age of Onset   Cancer Maternal Grandmother     Diabetes Neg Hx     Hypertension Neg Hx          Allergies  No Known Allergies         Current Outpatient Medications  Medication Sig Dispense Refill   escitalopram (LEXAPRO) 10 MG tablet Take 1 tablet (10 mg total) by mouth at bedtime. 30 tablet 2   hydrOXYzine (VISTARIL) 50 MG capsule Take 1 capsule (50 mg total) by mouth every 8 (eight) hours as needed. 30 capsule 1      No current facility-administered medications for this  visit.      Imaging Results (Last 48 hours)  No results found.     Review of Systems:   A ROS was performed including pertinent positives and negatives as documented in the HPI.   Physical Exam :   Constitutional: NAD and appears stated age Neurological: Alert and oriented Psych: Appropriate affect and cooperative There were no vitals taken for this visit.    Comprehensive Musculoskeletal Exam:     Active range of motion of the right knee from 0 to 120 degrees without crepitus.  Positive patellar grind.  Positive bilateral joint line tenderness, worse laterally.  No laxity with varus or valgus stress.  Knee strength 5/5 with flexion and extension.   Imaging:   MRI right knee: Chondral loss within the patellofemoral compartment.  Prior healed medial tibial plateau fracture.     I personally reviewed and interpreted the radiographs.     Assessment:   38 y.o. male with continued pain in the right knee as a result of an injury sustained over 3 months ago.  Review of MRI today does show a chondral defect within the patellofemoral compartment which is consistent with his symptoms while going up or down stairs and in a flexed position.  At this point he is still having persistent swelling.  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  Alternately I did discuss a two-stage Maci procedure.  I did discuss that the first stage would consist of a chondral biopsy that we would send the lab.  I did discuss the risks and limitations.   Plan :     -Plan for right knee arthroscopy with chondral biopsy   After a lengthy discussion of treatment options, including risks, benefits, alternatives, complications of surgical and nonsurgical conservative options, the patient elected surgical repair.   The patient  is aware of the material risks  and complications including, but not limited to injury to adjacent structures,  neurovascular injury, infection, numbness, bleeding, implant failure, thermal burns, stiffness, persistent pain, failure to heal, disease transmission from allograft, need for further surgery, dislocation, anesthetic risks, blood clots, risks of death,and others. The probabilities of surgical success and failure discussed with patient given their particular co-morbidities.The time and nature of expected rehabilitation and recovery was discussed.The patient's questions were all  answered preoperatively.  No barriers to understanding were noted. I explained the natural history of the disease process and Rx rationale.  I explained to the patient what I considered to be reasonable expectations given their personal situation.  The final treatment plan was arrived at through a shared patient decision making process model.          I personally saw and evaluated the patient, and participated in the management and treatment plan.

## 2024-01-22 ENCOUNTER — Ambulatory Visit: Payer: BC Managed Care – PPO | Admitting: Family

## 2024-01-23 ENCOUNTER — Ambulatory Visit (INDEPENDENT_AMBULATORY_CARE_PROVIDER_SITE_OTHER): Admitting: Family

## 2024-01-23 ENCOUNTER — Other Ambulatory Visit: Payer: Self-pay

## 2024-01-23 VITALS — BP 118/72 | HR 78 | Temp 98.1°F | Resp 16 | Ht 70.0 in | Wt 173.0 lb

## 2024-01-23 DIAGNOSIS — F32A Depression, unspecified: Secondary | ICD-10-CM

## 2024-01-23 DIAGNOSIS — F419 Anxiety disorder, unspecified: Secondary | ICD-10-CM

## 2024-01-23 MED ORDER — HYDROXYZINE PAMOATE 50 MG PO CAPS
50.0000 mg | ORAL_CAPSULE | Freq: Three times a day (TID) | ORAL | 1 refills | Status: DC | PRN
  Filled 2024-01-23: qty 30, 10d supply, fill #0

## 2024-01-23 MED ORDER — ESCITALOPRAM OXALATE 10 MG PO TABS
10.0000 mg | ORAL_TABLET | Freq: Every day | ORAL | 2 refills | Status: DC
Start: 2024-01-23 — End: 2024-04-28
  Filled 2024-01-23: qty 30, 30d supply, fill #0
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
## 2024-01-23 NOTE — Progress Notes (Unsigned)
 Patient is here for follow-up medication. Patient has no other concerns today

## 2024-02-04 ENCOUNTER — Other Ambulatory Visit: Payer: Self-pay

## 2024-02-07 ENCOUNTER — Other Ambulatory Visit: Payer: Self-pay

## 2024-02-07 ENCOUNTER — Encounter (HOSPITAL_BASED_OUTPATIENT_CLINIC_OR_DEPARTMENT_OTHER): Payer: Self-pay | Admitting: Orthopaedic Surgery

## 2024-02-07 DIAGNOSIS — M93961 Osteochondropathy, unspecified, right lower leg: Secondary | ICD-10-CM | POA: Diagnosis not present

## 2024-02-07 DIAGNOSIS — M2241 Chondromalacia patellae, right knee: Secondary | ICD-10-CM | POA: Diagnosis not present

## 2024-02-07 DIAGNOSIS — M238X1 Other internal derangements of right knee: Secondary | ICD-10-CM | POA: Diagnosis not present

## 2024-02-07 HISTORY — PX: KNEE ARTHROSCOPY: SHX127

## 2024-02-07 NOTE — Progress Notes (Signed)
   Date of Surgery: 02/07/2024  INDICATIONS: Mr. Cueto is a 38 y.o.-year-old male with right knee patella chondral lesion.  The risk and benefits of the procedure were discussed in detail and documented in the pre-operative evaluation.   PREOPERATIVE DIAGNOSIS: 1. Right knee patella chondral lesion  POSTOPERATIVE DIAGNOSIS: Same.  PROCEDURE: 1. Right knee chondral biopsy  SURGEON: Benancio Deeds MD  ASSISTANT: Ardeen Fillers, ATC  ANESTHESIA:  general  IV FLUIDS AND URINE: See anesthesia record.  ANTIBIOTICS: Ancef  ESTIMATED BLOOD LOSS: 5 mL.  IMPLANTS:  * No surgical log found *  DRAINS: None  CULTURES: None  COMPLICATIONS: none  DESCRIPTION OF PROCEDURE:  Examination under anesthesia: A careful examination under anesthesia was performed.  Knee ROM motion was: -3 - 135 Lachman: Normal Pivot Shift: Normal Posterior drawer: normal.   Varus stability in full extension: normal.   Varus stability in 30 degrees of flexion: normal.  Valgus stability in full extension: normal.   Valgus stability in 30 degrees of flexion: normal.  Posterolateral drawer: normal   Intra-operative findings: A thorough arthroscopic examination of the knee was performed.  The findings are: 1. Suprapatellar pouch: Normal 2. Undersurface of median ridge: Normal 3. Medial patellar facet: 3x2cm chondral loss 4. Lateral patellar facet: Normal 5. Trochlea: Normal 6. Lateral gutter/popliteus tendon: Normal 7. Hoffa's fat pad: Normal 8. Medial gutter/plica: Normal 9. ACL: Normal 10. PCL: Normal 11. Medial meniscus: Normal 12. Medial compartment cartilage: Normal 13. Lateral meniscus: Normal 14. Lateral compartment cartilage: Normal  I identified the patient in the pre-operative holding area.  I marked the operative knee with my initials. I reviewed the risks and benefits of the proposed surgical intervention and the patient wished to proceed.  Anesthesia performed a peripheral nerve block.   Patient was subsequently taken back to the operating room.  The patient was transferred to the operative suite and placed in the supine position with all bony prominences padded.     SCDs were placed on the non-operative lower extremity. Appropriate antibiotics was administered within 1 hour before incision. The operative lower extremity was then prepped and draped in standard fashion. A time out was performed confirming the correct extremity, correct patient and correct procedure.    A standard anterolateral portal was made with an 11 blade.  The ideal position for the anteromedial portal was established using a spinal needle.  This AM portal was then created under direct visualization with an 11 blade.  A full diagnostic arthroscopy was then performed, as described above, including probing of the chondral and meniscal surfaces.     At this time a curette was introduced into the knee and 3 TicTak size pieces of cartilage were harvested from the medial femoral condyle weightbearing surface.    That concluded the case.  Skin was closed with  3-0 nylon. Xeroform gauze, gauze, Tegaderm, Iceman and brace were applied.  Instrument, sponge, and needle counts were correct prior to wound closure and at the conclusion of the case.  The patient was taken to the PACU without complication   POSTOPERATIVE PLAN: he'll be weightbearing and activity as tolerated.  He was placed on 2 weeks of aspirin for blood clot prevention.  I'll see him back in 2 weeks for suture removal  Benancio Deeds, MD 1:54 PM

## 2024-02-08 ENCOUNTER — Encounter (HOSPITAL_BASED_OUTPATIENT_CLINIC_OR_DEPARTMENT_OTHER): Payer: Self-pay | Admitting: Orthopaedic Surgery

## 2024-02-11 ENCOUNTER — Telehealth (HOSPITAL_BASED_OUTPATIENT_CLINIC_OR_DEPARTMENT_OTHER): Payer: Self-pay

## 2024-02-11 NOTE — Telephone Encounter (Signed)
 Pt called and stated he wants to go on FMLA until 5/12. Went ahead and filled for it and said paperwork is on the way. He stated he is ok to pay the $25. Dr. Steward Drone is it out to write him out of work until 5/12?

## 2024-02-11 NOTE — Telephone Encounter (Signed)
 You have him going back today. Can I write him a OOW note until 4/21?

## 2024-02-12 NOTE — Telephone Encounter (Signed)
Will process when received

## 2024-02-15 ENCOUNTER — Telehealth (HOSPITAL_BASED_OUTPATIENT_CLINIC_OR_DEPARTMENT_OTHER): Payer: Self-pay

## 2024-02-15 NOTE — Telephone Encounter (Signed)
 IC, advised pt no form received yet. He is asking to remain oow through 5/9. Please provide note if okay.

## 2024-02-15 NOTE — Telephone Encounter (Signed)
 Patient called to check if you received his paperwork yet

## 2024-02-25 ENCOUNTER — Ambulatory Visit (HOSPITAL_BASED_OUTPATIENT_CLINIC_OR_DEPARTMENT_OTHER): Payer: Self-pay | Admitting: Orthopaedic Surgery

## 2024-02-25 ENCOUNTER — Ambulatory Visit (INDEPENDENT_AMBULATORY_CARE_PROVIDER_SITE_OTHER): Admitting: Orthopaedic Surgery

## 2024-02-25 ENCOUNTER — Other Ambulatory Visit (HOSPITAL_BASED_OUTPATIENT_CLINIC_OR_DEPARTMENT_OTHER): Payer: Self-pay

## 2024-02-25 ENCOUNTER — Encounter (HOSPITAL_COMMUNITY): Payer: Self-pay

## 2024-02-25 ENCOUNTER — Ambulatory Visit (HOSPITAL_COMMUNITY): Payer: Self-pay | Admitting: Family

## 2024-02-25 DIAGNOSIS — M238X1 Other internal derangements of right knee: Secondary | ICD-10-CM

## 2024-02-25 MED ORDER — ACETAMINOPHEN 500 MG PO TABS
500.0000 mg | ORAL_TABLET | Freq: Three times a day (TID) | ORAL | 0 refills | Status: AC
Start: 2024-02-25 — End: 2024-03-06
  Filled 2024-02-25: qty 30, 10d supply, fill #0

## 2024-02-25 MED ORDER — ASPIRIN 325 MG PO TBEC
325.0000 mg | DELAYED_RELEASE_TABLET | Freq: Every day | ORAL | 0 refills | Status: AC
  Filled 2024-02-25: qty 14, 14d supply, fill #0

## 2024-02-25 MED ORDER — OXYCODONE HCL 5 MG PO TABS
5.0000 mg | ORAL_TABLET | ORAL | 0 refills | Status: AC | PRN
Start: 2024-02-25 — End: ?
  Filled 2024-02-25: qty 20, 4d supply, fill #0

## 2024-02-25 MED ORDER — IBUPROFEN 800 MG PO TABS
800.0000 mg | ORAL_TABLET | Freq: Three times a day (TID) | ORAL | 0 refills | Status: AC
Start: 2024-02-25 — End: 2024-03-06
  Filled 2024-02-25: qty 30, 10d supply, fill #0

## 2024-02-25 NOTE — Progress Notes (Signed)
 Chief Complaint: Right knee pain        History of Present Illness:    02/25/2024: Presents today for follow-up of his right knee.  He is status post right knee arthroscopy 2 weeks out for chondral biopsy.  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  He still having symptoms with swelling and difficulty going up and down stairs   12/14/2023: Cary is here today for further evaluation and discussion of his right knee.  He reports that since his injury in late November 2024 he has continued to have consistent pain.  Reports that this pain is different than before and is located mainly in the posterolateral knee.  This is worse when his knee is in flexion or extension for long periods of time.  He does also note occasional buckling and locking.  He has been wearing a knee brace.  Has used Tylenol  and IcyHot for symptom relief.   Surgical History:   None   PMH/PSH/Family History/Social History/Meds/Allergies:         Past Medical History:  Diagnosis Date   Depression     Paranoid schizophrenia (HCC)               Past Surgical History:  Procedure Laterality Date   FRACTURE SURGERY        right foot   I & D EXTREMITY Left 11/03/2018    Procedure: IRRIGATION AND DEBRIDEMENT, OPEN FRACTURE;  Surgeon: Donnamarie Gables, MD;  Location: [facility] OR;  Service: Orthopedics;  Laterality: Left;   INTRAMEDULLARY (IM) NAIL INTERTROCHANTERIC Left 11/03/2018    Procedure: INTRAMEDULLARY (IM) NAIL, LEFT FEMUR;  Surgeon: Donnamarie Gables, MD;  Location: [facility] OR;  Service: Orthopedics;  Laterality: Left;        Social History         Socioeconomic History   Marital status: Single      Spouse name: Not on file   Number of children: Not on file   Years of education: Not on file   Highest education level: GED or equivalent  Occupational History   Not on file  Tobacco Use   Smoking status: Never   Smokeless  tobacco: Never  Vaping Use   Vaping status: Never Used  Substance and Sexual Activity   Alcohol use: Yes   Drug use: Not Currently   Sexual activity: Not on file  Other Topics Concern   Not on file  Social History Narrative    ** Merged History Encounter **         Social Drivers of Health        Financial Resource Strain: Medium Risk (10/22/2023)    Overall Financial Resource Strain (CARDIA)     Difficulty of Paying Living Expenses: Somewhat hard  Food Insecurity: Food Insecurity Present (10/22/2023)    Hunger Vital Sign     Worried About Running Out of Food in the Last Year: Sometimes true     Ran Out of Food in the Last Year: Sometimes true  Transportation Needs: No Transportation Needs (10/22/2023)    PRAPARE - Therapist, art (Medical): No     Lack of Transportation (Non-Medical): No  Physical Activity: Insufficiently Active (10/22/2023)    Exercise Vital Sign     Days of Exercise per Week: 4 days     Minutes of Exercise per Session: 30 min  Stress: Stress Concern Present (10/22/2023)  Minutes of Exercise per Session: 30 min  Stress: Stress Concern Present (10/22/2023)    [employer] of Occupational Health - Occupational Stress Questionnaire     Feeling of Stress : Very much  Social Connections: Socially Integrated (10/22/2023)    Social Connection and Isolation Panel [NHANES]     Frequency of Communication with Friends and Family: Three times a week     Frequency of Social Gatherings with Friends and Family: Once a week     Attends Religious Services: 1 to 4 times per year     Active Member of [employer] or Organizations: Yes     Attends Engineer, structural: More than 4 times per year     Marital Status: Living with partner             Family History  Problem Relation Age of Onset   Cancer Maternal Grandmother     Diabetes Neg Hx     Hypertension Neg Hx           Allergies  No Known Allergies               Current Outpatient Medications  Medication Sig Dispense Refill   escitalopram   (LEXAPRO ) 10 MG tablet Take 1 tablet (10 mg total) by mouth at bedtime. 30 tablet 2   hydrOXYzine  (VISTARIL ) 50 MG capsule Take 1 capsule (50 mg total) by mouth every 8 (eight) hours as needed. 30 capsule 1      No current facility-administered medications for this visit.      Imaging Results (Last 48 hours)  No results found.      Review of Systems:   A ROS was performed including pertinent positives and negatives as documented in the HPI.   Physical Exam :   Constitutional: NAD and appears stated age Neurological: Alert and oriented Psych: Appropriate affect and cooperative There were no vitals taken for this visit.    Comprehensive Musculoskeletal Exam:     Active range of motion of the right knee from 0 to 120 degrees without crepitus.  Positive patellar grind.  Positive bilateral joint line tenderness, worse laterally.  No laxity with varus or valgus stress.  Knee strength 5/5 with flexion and extension.   Imaging:   MRI right knee: Chondral loss within the patellofemoral compartment.  Prior healed medial tibial plateau fracture.     I personally reviewed and interpreted the radiographs.     Assessment:   38 y.o. male with continued pain in the right knee as a result of an injury sustained over 3 months ago.  I did discuss that his arthroscopy did reveal evidence of a full-thickness chondral loss involving the medial patellar facet.  I do believe this is responsible for symptoms particularly going up and down stairs.  This time he is quite limited with recurrent swelling and pain in the knee.  Given this I did discuss that I do believe he would ultimately be candidate for a Macy procedure.  I did discuss risks and limitations associated with this.  I discussed the associated recovery timeframe.  After discussion of all of this he is elected for right knee implantation   Plan :     -Plan for right knee arthrotomy with MACI implantation     After a lengthy discussion of  treatment options, including risks, benefits, alternatives, complications of surgical and nonsurgical conservative options, the patient elected surgical repair.    The patient  is aware of the material risks  and  complications including, but not limited to injury to adjacent structures, neurovascular injury, infection, numbness, bleeding, implant failure, thermal burns, stiffness, persistent pain, failure to heal, disease transmission from allograft, need for further surgery, dislocation, anesthetic risks, blood clots, risks of death,and others. The probabilities of surgical success and failure discussed with patient given their particular co-morbidities.The time and nature of expected rehabilitation and recovery was discussed.The patient's questions were all answered preoperatively.  No barriers to understanding were noted. I explained the natural history of the disease process and Rx rationale.  I explained to the patient what I considered to be reasonable expectations given their personal situation.  The final treatment plan was arrived at through a shared patient decision making process model.             I personally saw and evaluated the patient, and participated in the management and treatment plan.

## 2024-02-29 ENCOUNTER — Telehealth: Payer: Self-pay | Admitting: Orthopaedic Surgery

## 2024-02-29 ENCOUNTER — Other Ambulatory Visit (HOSPITAL_BASED_OUTPATIENT_CLINIC_OR_DEPARTMENT_OTHER): Payer: Self-pay | Admitting: Orthopaedic Surgery

## 2024-02-29 ENCOUNTER — Encounter (HOSPITAL_BASED_OUTPATIENT_CLINIC_OR_DEPARTMENT_OTHER): Payer: Self-pay | Admitting: Orthopaedic Surgery

## 2024-02-29 DIAGNOSIS — M238X1 Other internal derangements of right knee: Secondary | ICD-10-CM

## 2024-02-29 NOTE — Telephone Encounter (Signed)
 Patient has submitted forms. Please advise work status and provide note. If oow, please add start date. Thank you!

## 2024-03-03 NOTE — Telephone Encounter (Signed)
 Forms completed and faxed.

## 2024-03-06 ENCOUNTER — Telehealth (HOSPITAL_BASED_OUTPATIENT_CLINIC_OR_DEPARTMENT_OTHER): Payer: Self-pay

## 2024-03-06 NOTE — Telephone Encounter (Signed)
 Pt called the office and stated his FMLA stopped on 4/7 but needs this extended because he is scheduled for another surgery.

## 2024-03-07 NOTE — Telephone Encounter (Signed)
 Form received. I completed and faxed with work note [employer] [phone]

## 2024-03-20 ENCOUNTER — Encounter (HOSPITAL_BASED_OUTPATIENT_CLINIC_OR_DEPARTMENT_OTHER): Payer: Self-pay | Admitting: Orthopaedic Surgery

## 2024-03-20 ENCOUNTER — Other Ambulatory Visit: Payer: Self-pay

## 2024-03-27 ENCOUNTER — Encounter (HOSPITAL_BASED_OUTPATIENT_CLINIC_OR_DEPARTMENT_OTHER): Payer: Self-pay | Admitting: Orthopaedic Surgery

## 2024-03-27 ENCOUNTER — Ambulatory Visit (HOSPITAL_BASED_OUTPATIENT_CLINIC_OR_DEPARTMENT_OTHER): Admitting: Anesthesiology

## 2024-03-27 ENCOUNTER — Ambulatory Visit (HOSPITAL_COMMUNITY)
Admission: RE | Admit: 2024-03-27 | Discharge: 2024-03-27 | Disposition: A | Discharge status: Home / Self Care | Attending: Orthopaedic Surgery | Admitting: Orthopaedic Surgery

## 2024-03-27 ENCOUNTER — Other Ambulatory Visit: Payer: Self-pay

## 2024-03-27 ENCOUNTER — Encounter (HOSPITAL_BASED_OUTPATIENT_CLINIC_OR_DEPARTMENT_OTHER)
Admission: RE | Disposition: A | Discharge status: Home / Self Care | Payer: Self-pay | Source: Home / Self Care | Attending: Orthopaedic Surgery

## 2024-03-27 DIAGNOSIS — F319 Bipolar disorder, unspecified: Secondary | ICD-10-CM | POA: Diagnosis not present

## 2024-03-27 DIAGNOSIS — M238X1 Other internal derangements of right knee: Secondary | ICD-10-CM | POA: Diagnosis not present

## 2024-03-27 DIAGNOSIS — F419 Anxiety disorder, unspecified: Secondary | ICD-10-CM | POA: Diagnosis not present

## 2024-03-27 DIAGNOSIS — M2241 Chondromalacia patellae, right knee: Secondary | ICD-10-CM | POA: Diagnosis not present

## 2024-03-27 HISTORY — DX: Anxiety disorder, unspecified: F41.9

## 2024-03-27 HISTORY — PX: IMPLANTATION, MATRIX-INDUCED AUTOLOGOUS CHONDROCYTES: SHX7593

## 2024-03-27 HISTORY — DX: Bipolar disorder, unspecified: F31.9

## 2024-03-27 SURGERY — IMPLANTATION, MATRIX-INDUCED AUTOLOGOUS CHONDROCYTES
Anesthesia: General | Site: Knee | Laterality: Right

## 2024-03-27 MED ORDER — ATROPINE SULFATE 0.4 MG/ML IV SOLN
INTRAVENOUS | Status: AC
Start: 2024-03-27 — End: ?
  Filled 2024-03-27: qty 1

## 2024-03-27 MED ORDER — DEXMEDETOMIDINE HCL IN NACL 80 MCG/20ML IV SOLN
INTRAVENOUS | Status: DC | PRN
  Administered 2024-03-27: 12 ug via INTRAVENOUS
  Administered 2024-03-27: 8 ug via INTRAVENOUS

## 2024-03-27 MED ORDER — EPHEDRINE 5 MG/ML INJ
INTRAVENOUS | Status: AC
Start: 2024-03-27 — End: ?
  Filled 2024-03-27: qty 5

## 2024-03-27 MED ORDER — EPHEDRINE 5 MG/ML INJ
INTRAVENOUS | Status: AC
  Filled 2024-03-27: qty 5

## 2024-03-27 MED ORDER — DEXAMETHASONE SODIUM PHOSPHATE 4 MG/ML IJ SOLN
INTRAMUSCULAR | Status: DC | PRN
  Administered 2024-03-27: 5 mg via INTRAVENOUS

## 2024-03-27 MED ORDER — HYDROMORPHONE HCL 1 MG/ML IJ SOLN
INTRAMUSCULAR | Status: AC
  Filled 2024-03-27: qty 0.5

## 2024-03-27 MED ORDER — ACETAMINOPHEN 500 MG PO TABS
ORAL_TABLET | ORAL | Status: AC
  Filled 2024-03-27: qty 2

## 2024-03-27 MED ORDER — TISSEEL 10 ML EX KIT
PACK | CUTANEOUS | Status: DC | PRN
  Administered 2024-03-27: 2 mL via TOPICAL

## 2024-03-27 MED ORDER — GABAPENTIN 300 MG PO CAPS: 300.0000 mg | ORAL_CAPSULE | Freq: Once | ORAL | Status: AC

## 2024-03-27 MED ORDER — GABAPENTIN 300 MG PO CAPS
ORAL_CAPSULE | ORAL | Status: AC
  Filled 2024-03-27: qty 1

## 2024-03-27 MED ORDER — PROPOFOL 10 MG/ML IV BOLUS
INTRAVENOUS | Status: DC | PRN
  Administered 2024-03-27: 200 mg via INTRAVENOUS

## 2024-03-27 MED ORDER — FENTANYL CITRATE (PF) 100 MCG/2ML IJ SOLN
INTRAMUSCULAR | Status: DC | PRN
  Administered 2024-03-27: 25 ug via INTRAVENOUS
  Administered 2024-03-27 (×2): 50 ug via INTRAVENOUS
  Administered 2024-03-27: 25 ug via INTRAVENOUS
  Administered 2024-03-27: 50 ug via INTRAVENOUS

## 2024-03-27 MED ORDER — GABAPENTIN 300 MG PO CAPS
300.0000 mg | ORAL_CAPSULE | Freq: Once | ORAL | Status: AC
  Administered 2024-03-27: 300 mg via ORAL

## 2024-03-27 MED ORDER — MIDAZOLAM HCL 5 MG/5ML IJ SOLN
INTRAMUSCULAR | Status: DC | PRN
  Administered 2024-03-27: 2 mg via INTRAVENOUS

## 2024-03-27 MED ORDER — DEXAMETHASONE SODIUM PHOSPHATE 10 MG/ML IJ SOLN
INTRAMUSCULAR | Status: AC
  Filled 2024-03-27: qty 1

## 2024-03-27 MED ORDER — PHENYLEPHRINE 80 MCG/ML (10ML) SYRINGE FOR IV PUSH (FOR BLOOD PRESSURE SUPPORT)
PREFILLED_SYRINGE | INTRAVENOUS | Status: AC
  Filled 2024-03-27: qty 10

## 2024-03-27 MED ORDER — LIDOCAINE 2% (20 MG/ML) 5 ML SYRINGE
INTRAMUSCULAR | Status: AC
  Filled 2024-03-27: qty 5

## 2024-03-27 MED ORDER — TRANEXAMIC ACID-NACL 1000-0.7 MG/100ML-% IV SOLN: 1000.0000 mg | INTRAVENOUS | Status: AC

## 2024-03-27 MED ORDER — EPHEDRINE SULFATE (PRESSORS) 50 MG/ML IJ SOLN
INTRAMUSCULAR | Status: DC | PRN
  Administered 2024-03-27: 5 mg via INTRAVENOUS

## 2024-03-27 MED ORDER — FENTANYL CITRATE (PF) 100 MCG/2ML IJ SOLN
INTRAMUSCULAR | Status: AC
  Filled 2024-03-27: qty 2

## 2024-03-27 MED ORDER — LIDOCAINE 2% (20 MG/ML) 5 ML SYRINGE
INTRAMUSCULAR | Status: DC | PRN
  Administered 2024-03-27: 60 mg via INTRAVENOUS

## 2024-03-27 MED ORDER — CEFAZOLIN SODIUM-DEXTROSE 2-4 GM/100ML-% IV SOLN: 2.0000 g | INTRAVENOUS | Status: AC

## 2024-03-27 MED ORDER — PROPOFOL 10 MG/ML IV BOLUS
INTRAVENOUS | Status: AC
  Filled 2024-03-27: qty 20

## 2024-03-27 MED ORDER — CEFAZOLIN SODIUM-DEXTROSE 2-4 GM/100ML-% IV SOLN
INTRAVENOUS | Status: AC
  Filled 2024-03-27: qty 100

## 2024-03-27 MED ORDER — THROMBIN 5000 UNITS EX KIT
PACK | CUTANEOUS | Status: AC
  Filled 2024-03-27: qty 1

## 2024-03-27 MED ORDER — ACETAMINOPHEN 500 MG PO TABS: 1000.0000 mg | ORAL_TABLET | Freq: Once | ORAL | Status: AC

## 2024-03-27 MED ORDER — TRANEXAMIC ACID-NACL 1000-0.7 MG/100ML-% IV SOLN
INTRAVENOUS | Status: AC
  Filled 2024-03-27: qty 100

## 2024-03-27 MED ORDER — VANCOMYCIN HCL 1000 MG IV SOLR
INTRAVENOUS | Status: AC
  Filled 2024-03-27: qty 20

## 2024-03-27 MED ORDER — LACTATED RINGERS IV SOLN: INTRAVENOUS | Status: DC

## 2024-03-27 MED ORDER — MIDAZOLAM HCL 2 MG/2ML IJ SOLN
INTRAMUSCULAR | Status: AC
  Filled 2024-03-27: qty 2

## 2024-03-27 MED ORDER — TRANEXAMIC ACID-NACL 1000-0.7 MG/100ML-% IV SOLN
1000.0000 mg | INTRAVENOUS | Status: AC
  Administered 2024-03-27: 1000 mg via INTRAVENOUS

## 2024-03-27 MED ORDER — ONDANSETRON HCL 4 MG/2ML IJ SOLN
INTRAMUSCULAR | Status: DC | PRN
  Administered 2024-03-27: 4 mg via INTRAVENOUS

## 2024-03-27 MED ORDER — ONDANSETRON HCL 4 MG/2ML IJ SOLN
INTRAMUSCULAR | Status: AC
  Filled 2024-03-27: qty 2

## 2024-03-27 MED ORDER — HYDROMORPHONE HCL 1 MG/ML IJ SOLN
0.2500 mg | INTRAMUSCULAR | Status: DC | PRN
  Administered 2024-03-27 (×4): 0.5 mg via INTRAVENOUS

## 2024-03-27 MED ORDER — ACETAMINOPHEN 500 MG PO TABS
1000.0000 mg | ORAL_TABLET | Freq: Once | ORAL | Status: AC
  Administered 2024-03-27: 1000 mg via ORAL

## 2024-03-27 MED ORDER — CEFAZOLIN SODIUM-DEXTROSE 2-4 GM/100ML-% IV SOLN
2.0000 g | INTRAVENOUS | Status: AC
  Administered 2024-03-27: 2 g via INTRAVENOUS

## 2024-03-27 SURGICAL SUPPLY — 37 items
BANDAGE ESMARK 6X9 LF (GAUZE/BANDAGES/DRESSINGS) IMPLANT
BLADE SURG 15 STRL LF DISP TIS (BLADE) ×2 IMPLANT
BNDG ELASTIC 4INX 5YD STR LF (GAUZE/BANDAGES/DRESSINGS) ×1 IMPLANT
BNDG ELASTIC 6INX 5YD STR LF (GAUZE/BANDAGES/DRESSINGS) ×1 IMPLANT
CHLORAPREP W/TINT 26 (MISCELLANEOUS) ×1 IMPLANT
COOLER ICEMAN CLASSIC (MISCELLANEOUS) ×1 IMPLANT
DRAPE EXTREMITY T 121X128X90 (DISPOSABLE) ×1 IMPLANT
DRAPE IMP U-DRAPE 54X76 (DRAPES) IMPLANT
DRAPE INCISE IOBAN 66X45 STRL (DRAPES) IMPLANT
DRAPE U-SHAPE 47X51 STRL (DRAPES) ×1 IMPLANT
ELECTRODE REM PT RTRN 9FT ADLT (ELECTROSURGICAL) IMPLANT
GAUZE 4X4 16PLY ~~LOC~~+RFID DBL (SPONGE) IMPLANT
GAUZE PAD ABD 8X10 STRL (GAUZE/BANDAGES/DRESSINGS) ×1 IMPLANT
GAUZE SPONGE 4X4 12PLY STRL (GAUZE/BANDAGES/DRESSINGS) ×1 IMPLANT
GAUZE XEROFORM 1X8 LF (GAUZE/BANDAGES/DRESSINGS) ×1 IMPLANT
GLOVE BIO SURGEON STRL SZ 6 (GLOVE) ×1 IMPLANT
GLOVE BIO SURGEON STRL SZ7.5 (GLOVE) ×1 IMPLANT
GLOVE BIOGEL PI IND STRL 6.5 (GLOVE) ×1 IMPLANT
GLOVE BIOGEL PI IND STRL 8 (GLOVE) ×1 IMPLANT
GOWN STRL REUS W/ TWL LRG LVL3 (GOWN DISPOSABLE) ×1 IMPLANT
GOWN STRL REUS W/ TWL XL LVL3 (GOWN DISPOSABLE) ×1 IMPLANT
IMMOBILIZER KNEE 24 THIGH 36 (MISCELLANEOUS) IMPLANT
IMMOBILIZER KNEE 24 UNIV (MISCELLANEOUS) IMPLANT
IMPL AUTOLOGUS MACI 2 MEMBRANE (Tissue) IMPLANT
PACK ARTHROSCOPY DSU (CUSTOM PROCEDURE TRAY) ×1 IMPLANT
PACK BASIN DAY SURGERY FS (CUSTOM PROCEDURE TRAY) ×1 IMPLANT
PAD COLD SHLDR WRAP-ON (PAD) ×1 IMPLANT
PADDING CAST COTTON 6X4 STRL (CAST SUPPLIES) ×1 IMPLANT
PENCIL SMOKE EVACUATOR (MISCELLANEOUS) IMPLANT
SLEEVE SCD COMPRESS KNEE MED (STOCKING) ×1 IMPLANT
SPONGE INTESTINAL PEANUT (DISPOSABLE) ×1 IMPLANT
SPONGE T-LAP 18X18 ~~LOC~~+RFID (SPONGE) ×2 IMPLANT
SUT CHROMIC 3 0 SH 27 (SUTURE) ×1 IMPLANT
SUT ETHILON 3 0 PS 1 (SUTURE) ×1 IMPLANT
SUT VIC AB 0 CT1 27XBRD ANBCTR (SUTURE) ×1 IMPLANT
SUT VIC AB 2-0 CT1 TAPERPNT 27 (SUTURE) ×1 IMPLANT
TOWEL GREEN STERILE FF (TOWEL DISPOSABLE) ×2 IMPLANT

## 2024-03-27 NOTE — H&P (Signed)
 Chief Complaint: Right knee pain        History of Present Illness:    02/25/2024: Presents today for follow-up of his right knee.  He is status post right knee arthroscopy 2 weeks out for chondral biopsy.  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  He still having symptoms with swelling and difficulty going up and down stairs   12/14/2023: Jacob Rogers is here today for further evaluation and discussion of his right knee.  He reports that since his injury in late November 2024 he has continued to have consistent pain.  Reports that this pain is different than before and is located mainly in the posterolateral knee.  This is worse when his knee is in flexion or extension for long periods of time.  He does also note occasional buckling and locking.  He has been wearing a knee brace.  Has used Tylenol  and IcyHot for symptom relief.   Surgical History:   None   PMH/PSH/Family History/Social History/Meds/Allergies:            Past Medical History:  Diagnosis Date   Depression     Paranoid schizophrenia (HCC)                    Past Surgical History:  Procedure Laterality Date   FRACTURE SURGERY        right foot   I & D EXTREMITY Left 11/03/2018    Procedure: IRRIGATION AND DEBRIDEMENT, OPEN FRACTURE;  Surgeon: Donnamarie Gables, MD;  Location: [facility] OR;  Service: Orthopedics;  Laterality: Left;   INTRAMEDULLARY (IM) NAIL INTERTROCHANTERIC Left 11/03/2018    Procedure: INTRAMEDULLARY (IM) NAIL, LEFT FEMUR;  Surgeon: Donnamarie Gables, MD;  Location: [facility] OR;  Service: Orthopedics;  Laterality: Left;         Social History             Socioeconomic History   Marital status: Single      Spouse name: Not on file   Number of children: Not on file   Years of education: Not on file   Highest education level: GED or equivalent  Occupational History   Not on file  Tobacco Use   Smoking status: Never    Smokeless tobacco: Never  Vaping Use   Vaping status: Never Used  Substance and Sexual Activity   Alcohol use: Yes   Drug use: Not Currently   Sexual activity: Not on file  Other Topics Concern   Not on file  Social History Narrative    ** Merged History Encounter **         Social Drivers of Health           Financial Resource Strain: Medium Risk (10/22/2023)    Overall Financial Resource Strain (CARDIA)     Difficulty of Paying Living Expenses: Somewhat hard  Food Insecurity: Food Insecurity Present (10/22/2023)    Hunger Vital Sign     Worried About Running Out of Food in the Last Year: Sometimes true     Ran Out of Food in the Last Year: Sometimes true  Transportation Needs: No Transportation Needs (10/22/2023)    PRAPARE - Therapist, art (Medical): No     Lack of Transportation (Non-Medical): No  Physical Activity: Insufficiently Active (10/22/2023)    Exercise Vital Sign     Days of Exercise per Week: 4 days  Minutes of Exercise per Session: 30 min  Stress: Stress Concern Present (10/22/2023)    [employer] of Occupational Health - Occupational Stress Questionnaire     Feeling of Stress : Very much  Social Connections: Socially Integrated (10/22/2023)    Social Connection and Isolation Panel [NHANES]     Frequency of Communication with Friends and Family: Three times a week     Frequency of Social Gatherings with Friends and Family: Once a week     Attends Religious Services: 1 to 4 times per year     Active Member of [employer] or Organizations: Yes     Attends Engineer, structural: More than 4 times per year     Marital Status: Living with partner             Family History  Problem Relation Age of Onset   Cancer Maternal Grandmother     Diabetes Neg Hx     Hypertension Neg Hx           Allergies  No Known Allergies               Current Outpatient Medications  Medication Sig Dispense Refill   escitalopram   (LEXAPRO ) 10 MG tablet Take 1 tablet (10 mg total) by mouth at bedtime. 30 tablet 2   hydrOXYzine  (VISTARIL ) 50 MG capsule Take 1 capsule (50 mg total) by mouth every 8 (eight) hours as needed. 30 capsule 1      No current facility-administered medications for this visit.      Imaging Results (Last 48 hours)  No results found.      Review of Systems:   A ROS was performed including pertinent positives and negatives as documented in the HPI.   Physical Exam :   Constitutional: NAD and appears stated age Neurological: Alert and oriented Psych: Appropriate affect and cooperative There were no vitals taken for this visit.    Comprehensive Musculoskeletal Exam:     Active range of motion of the right knee from 0 to 120 degrees without crepitus.  Positive patellar grind.  Positive bilateral joint line tenderness, worse laterally.  No laxity with varus or valgus stress.  Knee strength 5/5 with flexion and extension.   Imaging:   MRI right knee: Chondral loss within the patellofemoral compartment.  Prior healed medial tibial plateau fracture.     I personally reviewed and interpreted the radiographs.     Assessment:   38 y.o. male with continued pain in the right knee as a result of an injury sustained over 3 months ago.  I did discuss that his arthroscopy did reveal evidence of a full-thickness chondral loss involving the medial patellar facet.  I do believe this is responsible for symptoms particularly going up and down stairs.  This time he is quite limited with recurrent swelling and pain in the knee.  Given this I did discuss that I do believe he would ultimately be candidate for a Macy procedure.  I did discuss risks and limitations associated with this.  I discussed the associated recovery timeframe.  After discussion of all of this he is elected for right knee implantation   Plan :     -Plan for right knee arthrotomy with MACI implantation     After a lengthy discussion of  treatment options, including risks, benefits, alternatives, complications of surgical and nonsurgical conservative options, the patient elected surgical repair.    The patient  is aware of the material risks  and  complications including, but not limited to injury to adjacent structures, neurovascular injury, infection, numbness, bleeding, implant failure, thermal burns, stiffness, persistent pain, failure to heal, disease transmission from allograft, need for further surgery, dislocation, anesthetic risks, blood clots, risks of death,and others. The probabilities of surgical success and failure discussed with patient given their particular co-morbidities.The time and nature of expected rehabilitation and recovery was discussed.The patient's questions were all answered preoperatively.  No barriers to understanding were noted. I explained the natural history of the disease process and Rx rationale.  I explained to the patient what I considered to be reasonable expectations given their personal situation.  The final treatment plan was arrived at through a shared patient decision making process model.             I personally saw and evaluated the patient, and participated in the management and treatment plan.

## 2024-03-27 NOTE — Brief Op Note (Signed)
   Brief Op Note  Date of Surgery: 03/27/2024  Preoperative Diagnosis: RIGHT KNEE CHONDRAL LOSS  Postoperative Diagnosis: same  Procedure: Procedure(s): IMPLANTATION, MATRIX-INDUCED AUTOLOGOUS CHONDROCYTES  Implants: Implant Name Type Inv. Item Serial No. Manufacturer Lot No. LRB No. Used Action  IMPL AUTOLOGUS MACI 2 MEMBRANE - [id number] Tissue IMPL AUTOLOGUS MACI 2 MEMBRANE  VERICEL [id number] Right 1 Implanted    Surgeons: Surgeon(s): Wilhelmenia Harada, MD  Anesthesia: General    Estimated Blood Loss: See anesthesia record  Complications: None  Condition to PACU: Stable  Carmina Chris, MD 03/27/2024 1:52 PM

## 2024-03-27 NOTE — Transfer of Care (Signed)
 Immediate Anesthesia Transfer of Care Note  Patient: Jacob Rogers  Procedure(s) Performed: IMPLANTATION, MATRIX-INDUCED AUTOLOGOUS CHONDROCYTES (Right: Knee)  Patient Location: PACU  Anesthesia Type:General  Level of Consciousness: drowsy  Airway & Oxygen Therapy: Patient Spontanous Breathing and Patient connected to face mask oxygen  Post-op Assessment: Report given to RN and Post -op Vital signs reviewed and stable  Post vital signs: Reviewed and stable  Last Vitals:  Vitals Value Taken Time  BP 104/60 03/27/24 1355  Temp 36.6 C 03/27/24 1357  Pulse 60 03/27/24 1357  Resp 12 03/27/24 1357  SpO2 100 % 03/27/24 1357  Vitals shown include unfiled device data.  Last Pain:  Vitals:   03/27/24 1158  TempSrc: Temporal  PainSc: 3       Patients Stated Pain Goal: 3 (03/27/24 1158)  Complications: No notable events documented.

## 2024-03-27 NOTE — Op Note (Signed)
 Date of Surgery: 03/27/2024  INDICATIONS: Jacob Rogers is a 38 y.o.-year-old male with right knee patella full-thickness medial patellar facet chondral loss.  The risk and benefits of the procedure were discussed in detail and documented in the pre-operative evaluation.   PREOPERATIVE DIAGNOSIS: 1.  Right knee 3 x 2 cm medial patellar facet chondral loss with fissuring full-thickness  POSTOPERATIVE DIAGNOSIS: Same.  PROCEDURE: 1.  Right knee placement of matrix associated chondrocyte implantation membrane  SURGEON: Carmina Chris MD  ASSISTANT: Deon Flatter, ATC  ANESTHESIA:  general  IV FLUIDS AND URINE: See anesthesia record.  ANTIBIOTICS: Ancef   ESTIMATED BLOOD LOSS: 10 mL.  IMPLANTS:  Implant Name Type Inv. Item Serial No. Manufacturer Lot No. LRB No. Used Action  IMPL AUTOLOGUS MACI 2 MEMBRANE - [id number] Tissue IMPL AUTOLOGUS MACI 2 MEMBRANE  VERICEL [id number] Right 1 Implanted    DRAINS: None  CULTURES: None  COMPLICATIONS: none  DESCRIPTION OF PROCEDURE:   I identified the patient in the pre-operative holding area.  I marked the operative knee with my initials. I reviewed the risks and benefits of the proposed surgical intervention and the patient wished to proceed.  Anesthesia performed a peripheral nerve block.  Patient was subsequently taken back to the operating room.  The patient was transferred to the operative suite and placed in the supine position with all bony prominences padded.     SCDs were placed on the non-operative lower extremity. Appropriate antibiotics was administered within 1 hour before incision. The operative lower extremity was then prepped and draped in standard fashion. A time out was performed confirming the correct extremity, correct patient and correct procedure.    A standard midline approach was used to the knee.  15 blade was used to incise through skin.  Electrocautery was used to achieve hemostasis.  Electrocautery was used to create  a medial arthrotomy with hemostat placed underneath the retinaculum as well as quadriceps in order to protect the cartilage.  The patella was everted and 2 K wires 0.63 were placed into the medial patella in order to use as a retraction assistive device.  Once the patella was everted the cartilage was evaluated.  Corresponding shape and cut out of the Maci sat was selected and this was placed around the existing cartilage damage in the medial patellar facet measuring 3 x 2 cm.  Once this was placed a curette was used in order to remove the damaged cartilage to the level of the subchondral bone with care not to violate it.  This was thoroughly irrigated.  There was a healthy wall surrounding this.  At this time the base of the defect was covered with fibrin glue.  Corresponding shape was cut out the Maci brain and then this was placed cell side down onto the glue layer.  A peanut was used to adhere this to the subchondral bone.  A surrounding layer of fibrin glue was placed in 3 minutes was allowed for the glue to harden.  The knee was taken through cycles without displacement.   That concluded the case.  0 Vicryl was used to close the retinaculum.  Skin was closed with 2-0 Vicryl and 3-0 nylon. Xeroform gauze, gauze, Tegaderm, Iceman and brace were applied.  Instrument, sponge, and needle counts were correct prior to wound closure and at the conclusion of the case.  The patient was taken to the PACU without complication   POSTOPERATIVE PLAN: He will be weightbearing as tolerated on the right leg.  He  will follow the matrix associated chondrocyte implantation recovery protocol.  I will plan to see him back in 2 weeks for suture removal.  He will begin physical therapy immediately postop  Carmina Chris, MD 1:52 PM

## 2024-03-27 NOTE — Anesthesia Postprocedure Evaluation (Signed)
 Anesthesia Post Note  Patient: ELVIE MAINES  Procedure(s) Performed: IMPLANTATION, MATRIX-INDUCED AUTOLOGOUS CHONDROCYTES (Right: Knee)     Patient location during evaluation: PACU Anesthesia Type: General Level of consciousness: awake and alert Pain management: pain level controlled Vital Signs Assessment: post-procedure vital signs reviewed and stable Respiratory status: spontaneous breathing, nonlabored ventilation and respiratory function stable Cardiovascular status: stable and blood pressure returned to baseline Anesthetic complications: no  No notable events documented.  Last Vitals:  Vitals:   03/27/24 1430 03/27/24 1445  BP: 112/73 117/75  Pulse: 61 60  Resp: 12 12  Temp:    SpO2: 100% 94%    Last Pain:  Vitals:   03/27/24 1453  TempSrc:   PainSc: Asleep        RLE Motor Response: Purposeful movement;Responds to commands (03/27/24 1445) RLE Sensation: Full sensation;Pain (03/27/24 1445)      Juventino Oppenheim

## 2024-03-27 NOTE — Anesthesia Preprocedure Evaluation (Addendum)
 Anesthesia Evaluation  Patient identified by MRN, date of birth, ID band Patient awake    Reviewed: Allergy & Precautions, H&P , NPO status , Patient's Chart, lab work & pertinent test results  Airway Mallampati: II  TM Distance: >3 FB Neck ROM: Full    Dental no notable dental hx. (+) Teeth Intact, Dental Advisory Given   Pulmonary neg pulmonary ROS   Pulmonary exam normal breath sounds clear to auscultation       Cardiovascular negative cardio ROS  Rhythm:Regular Rate:Normal     Neuro/Psych   Anxiety Depression Bipolar Disorder   negative neurological ROS     GI/Hepatic negative GI ROS, Neg liver ROS,,,  Endo/Other  negative endocrine ROS    Renal/GU negative Renal ROS  negative genitourinary   Musculoskeletal   Abdominal   Peds  Hematology negative hematology ROS (+)   Anesthesia Other Findings   Reproductive/Obstetrics negative OB ROS                             Anesthesia Physical Anesthesia Plan  ASA: 2  Anesthesia Plan: General   Post-op Pain Management: Regional block*, Tylenol  PO (pre-op)* and Precedex   Induction: Intravenous  PONV Risk Score and Plan: 3 and Ondansetron , Dexamethasone and Midazolam   Airway Management Planned: LMA  Additional Equipment:   Intra-op Plan:   Post-operative Plan: Extubation in OR  Informed Consent: I have reviewed the patients History and Physical, chart, labs and discussed the procedure including the risks, benefits and alternatives for the proposed anesthesia with the patient or authorized representative who has indicated his/her understanding and acceptance.     Dental advisory given  Plan Discussed with: CRNA  Anesthesia Plan Comments:        Anesthesia Quick Evaluation

## 2024-03-27 NOTE — Anesthesia Procedure Notes (Signed)
 Procedure Name: LMA Insertion Date/Time: 03/27/2024 12:38 PM  Performed by: Leverne Reading, CRNAPre-anesthesia Checklist: Patient identified, Emergency Drugs available, Suction available and Patient being monitored Patient Re-evaluated:Patient Re-evaluated prior to induction Oxygen Delivery Method: Circle System Utilized Preoxygenation: Pre-oxygenation with 100% oxygen Induction Type: IV induction Ventilation: Mask ventilation without difficulty LMA: LMA inserted LMA Size: 5.0 Number of attempts: 1 Airway Equipment and Method: Bite block Placement Confirmation: positive ETCO2 Tube secured with: Tape Dental Injury: Teeth and Oropharynx as per pre-operative assessment

## 2024-03-27 NOTE — Discharge Instructions (Addendum)
 Discharge Instructions    Attending Surgeon: Jacob Harada, MD Office Phone Number: [phone]   Diagnosis and Procedures:    Surgeries Performed: Right knee matrix associated chondrocyte implantation  Discharge Plan:    Diet: Resume usual diet. Begin with light or bland foods.  Drink plenty of fluids.  Activity:  Weightbearing as tolerated right leg. You are advised to go home directly from the hospital or surgical center. Restrict your activities.  GENERAL INSTRUCTIONS: 1.  Please apply ice to your wound to help with swelling and inflammation. This will improve your comfort and your overall recovery following surgery.     2. Please call Dr. Verline Glow office at [phone] with questions Monday-Friday during business hours. If no one answers, please leave a message and someone should get back to the patient within 24 hours. For emergencies please call 911 or proceed to the emergency room.   3. Patient to notify surgical team if experiences any of the following: Bowel/Bladder dysfunction, uncontrolled pain, nerve/muscle weakness, incision with increased drainage or redness, nausea/vomiting and Fever greater than 101.0 F.  Be alert for signs of infection including redness, streaking, odor, fever or chills. Be alert for excessive pain or bleeding and notify your surgeon immediately.  WOUND INSTRUCTIONS:   Leave your dressing, cast, or splint in place until your post operative visit.  Keep it clean and dry.  Always keep the incision clean and dry until the staples/sutures are removed. If there is no drainage from the incision you should keep it open to air. If there is drainage from the incision you must keep it covered at all times until the drainage stops  Do not soak in a bath tub, hot tub, pool, lake or other body of water  until 21 days after your surgery and your incision is completely dry and healed.  If you have removable sutures (or staples) they must be removed  10-14 days (unless otherwise instructed) from the day of your surgery.     1)  Elevate the extremity as much as possible.  2)  Keep the dressing clean and dry.  3)  Please call us  if the dressing becomes wet or dirty.  4)  If you are experiencing worsening pain or worsening swelling, please call.     MEDICATIONS: Resume all previous home medications at the previous prescribed dose and frequency unless otherwise noted Start taking the  pain medications on an as-needed basis as prescribed  Please taper down pain medication over the next week following surgery.  Ideally you should not require a refill of any narcotic pain medication.  Take pain medication with food to minimize nausea. In addition to the prescribed pain medication, you may take over-the-counter pain relievers such as Tylenol .  Do NOT take additional tylenol  if your pain medication already has tylenol  in it.  Aspirin  325mg  daily per instructions on bottle. Narcotic policy: Per [facility] clinic policy, our goal is ensure optimal postoperative pain control with a multimodal pain management strategy. For all [facility] patients, our goal is to wean post-operative narcotic medications by 6 weeks post-operatively, and many times sooner. If this is not possible due to utilization of pain medication prior to surgery, your [facility] doctor will support your acute post-operative pain control for the first 6 weeks postoperatively, with a plan to transition you back to your primary pain team following that. Max Spain will work to ensure a Therapist, occupational.       FOLLOWUP INSTRUCTIONS: 1. Follow up at the Physical  Therapy Clinic 3-4 days following surgery. This appointment should be scheduled unless other arrangements have been made.The Physical Therapy scheduling number is [phone] if an appointment has not already been arranged.  2. Contact Dr. Verline Glow office during office hours at [phone] or the practice after hours line at  [phone] for non-emergencies. For medical emergencies call 911.   Discharge Location: Home  Post Anesthesia Home Care Instructions  Activity: Get plenty of rest for the remainder of the day. A responsible individual must stay with you for 24 hours following the procedure.  For the next 24 hours, DO NOT: -Drive a car -Advertising copywriter -Drink alcoholic beverages -Take any medication unless instructed by your physician -Make any legal decisions or sign important papers.  Meals: Start with liquid foods such as gelatin or soup. Progress to regular foods as tolerated. Avoid greasy, spicy, heavy foods. If nausea and/or vomiting occur, drink only clear liquids until the nausea and/or vomiting subsides. Call your physician if vomiting continues.  Special Instructions/Symptoms: Your throat may feel dry or sore from the anesthesia or the breathing tube placed in your throat during surgery. If this causes discomfort, gargle with warm salt water . The discomfort should disappear within 24 hours.  If you had a scopolamine patch placed behind your ear for the management of post- operative nausea and/or vomiting:  1. The medication in the patch is effective for 72 hours, after which it should be removed.  Wrap patch in a tissue and discard in the trash. Wash hands thoroughly with soap and water . 2. You may remove the patch earlier than 72 hours if you experience unpleasant side effects which may include dry mouth, dizziness or visual disturbances. 3. Avoid touching the patch. Wash your hands with soap and water  after contact with the patch.      Next dose of Tylenol  may be given at 6:00pm if needed.

## 2024-03-27 NOTE — Interval H&P Note (Signed)
 History and Physical Interval Note:  03/27/2024 11:57 AM  Jacob Rogers  has presented today for surgery, with the diagnosis of RIGHT KNEE CHONDRAL LOSS.  The various methods of treatment have been discussed with the patient and family. After consideration of risks, benefits and other options for treatment, the patient has consented to  Procedure(s): IMPLANTATION, MATRIX-INDUCED AUTOLOGOUS CHONDROCYTES (Right) as a surgical intervention.  The patient's history has been reviewed, patient examined, no change in status, stable for surgery.  I have reviewed the patient's chart and labs.  Questions were answered to the patient's satisfaction.     Arantxa Piercey

## 2024-03-27 NOTE — Progress Notes (Signed)
 Patient was counseled in the preoperative area and discussed the risks and benefits of right knee arthrotomy with matrix associated chondrocyte implantation and again it was confirmed that this would be the procedure performed today with his verbal consent in the presence of his family.  He was consenting to the specific procedure.

## 2024-03-28 ENCOUNTER — Encounter (HOSPITAL_BASED_OUTPATIENT_CLINIC_OR_DEPARTMENT_OTHER): Payer: Self-pay | Admitting: Orthopaedic Surgery

## 2024-04-01 ENCOUNTER — Ambulatory Visit: Attending: Orthopaedic Surgery

## 2024-04-01 DIAGNOSIS — M238X1 Other internal derangements of right knee: Secondary | ICD-10-CM | POA: Insufficient documentation

## 2024-04-01 DIAGNOSIS — R2689 Other abnormalities of gait and mobility: Secondary | ICD-10-CM | POA: Insufficient documentation

## 2024-04-01 DIAGNOSIS — M6281 Muscle weakness (generalized): Secondary | ICD-10-CM | POA: Insufficient documentation

## 2024-04-01 DIAGNOSIS — M25561 Pain in right knee: Secondary | ICD-10-CM | POA: Diagnosis not present

## 2024-04-01 DIAGNOSIS — R600 Localized edema: Secondary | ICD-10-CM | POA: Insufficient documentation
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
## 2024-04-02 ENCOUNTER — Encounter (HOSPITAL_BASED_OUTPATIENT_CLINIC_OR_DEPARTMENT_OTHER): Payer: Self-pay | Admitting: Orthopaedic Surgery

## 2024-04-03 ENCOUNTER — Ambulatory Visit

## 2024-04-03 DIAGNOSIS — R2689 Other abnormalities of gait and mobility: Secondary | ICD-10-CM | POA: Diagnosis not present

## 2024-04-03 DIAGNOSIS — M6281 Muscle weakness (generalized): Secondary | ICD-10-CM | POA: Diagnosis not present

## 2024-04-03 DIAGNOSIS — M238X1 Other internal derangements of right knee: Secondary | ICD-10-CM | POA: Diagnosis not present

## 2024-04-03 DIAGNOSIS — R600 Localized edema: Secondary | ICD-10-CM

## 2024-04-03 DIAGNOSIS — M25561 Pain in right knee: Secondary | ICD-10-CM

## 2024-04-03 NOTE — Telephone Encounter (Signed)
 I spoke with patient.
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
## 2024-04-08 ENCOUNTER — Ambulatory Visit: Attending: Orthopaedic Surgery

## 2024-04-08 DIAGNOSIS — M6281 Muscle weakness (generalized): Secondary | ICD-10-CM | POA: Diagnosis not present

## 2024-04-08 DIAGNOSIS — R2689 Other abnormalities of gait and mobility: Secondary | ICD-10-CM | POA: Diagnosis not present

## 2024-04-08 DIAGNOSIS — R600 Localized edema: Secondary | ICD-10-CM | POA: Diagnosis not present

## 2024-04-08 DIAGNOSIS — M25561 Pain in right knee: Secondary | ICD-10-CM | POA: Diagnosis not present
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
## 2024-04-10 ENCOUNTER — Other Ambulatory Visit (HOSPITAL_BASED_OUTPATIENT_CLINIC_OR_DEPARTMENT_OTHER): Payer: Self-pay

## 2024-04-10 ENCOUNTER — Ambulatory Visit (HOSPITAL_BASED_OUTPATIENT_CLINIC_OR_DEPARTMENT_OTHER): Admitting: Orthopaedic Surgery

## 2024-04-10 DIAGNOSIS — M238X1 Other internal derangements of right knee: Secondary | ICD-10-CM

## 2024-04-10 DIAGNOSIS — S82141A Displaced bicondylar fracture of right tibia, initial encounter for closed fracture: Secondary | ICD-10-CM

## 2024-04-10 MED ORDER — ACETAMINOPHEN 500 MG PO TABS
500.0000 mg | ORAL_TABLET | Freq: Three times a day (TID) | ORAL | 0 refills | Status: AC
  Filled 2024-04-10: qty 30, 10d supply, fill #0

## 2024-04-10 MED ORDER — IBUPROFEN 800 MG PO TABS
800.0000 mg | ORAL_TABLET | Freq: Three times a day (TID) | ORAL | 0 refills | Status: AC
  Filled 2024-04-10: qty 30, 10d supply, fill #0
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
## 2024-04-11 ENCOUNTER — Ambulatory Visit

## 2024-04-11 DIAGNOSIS — M6281 Muscle weakness (generalized): Secondary | ICD-10-CM

## 2024-04-11 DIAGNOSIS — M25561 Pain in right knee: Secondary | ICD-10-CM

## 2024-04-11 DIAGNOSIS — R2689 Other abnormalities of gait and mobility: Secondary | ICD-10-CM

## 2024-04-11 DIAGNOSIS — R600 Localized edema: Secondary | ICD-10-CM | POA: Diagnosis not present
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
## 2024-04-15 ENCOUNTER — Ambulatory Visit: Admitting: Physical Therapy

## 2024-04-15 ENCOUNTER — Encounter: Payer: Self-pay | Admitting: Physical Therapy

## 2024-04-15 DIAGNOSIS — M6281 Muscle weakness (generalized): Secondary | ICD-10-CM | POA: Diagnosis not present

## 2024-04-15 DIAGNOSIS — R2689 Other abnormalities of gait and mobility: Secondary | ICD-10-CM | POA: Diagnosis not present

## 2024-04-15 DIAGNOSIS — M25561 Pain in right knee: Secondary | ICD-10-CM

## 2024-04-15 DIAGNOSIS — R600 Localized edema: Secondary | ICD-10-CM | POA: Diagnosis not present
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
## 2024-04-17 ENCOUNTER — Ambulatory Visit: Admitting: Physical Therapy

## 2024-04-17 ENCOUNTER — Encounter: Payer: Self-pay | Admitting: Physical Therapy

## 2024-04-17 DIAGNOSIS — M6281 Muscle weakness (generalized): Secondary | ICD-10-CM | POA: Diagnosis not present

## 2024-04-17 DIAGNOSIS — M25561 Pain in right knee: Secondary | ICD-10-CM | POA: Diagnosis not present

## 2024-04-17 DIAGNOSIS — R600 Localized edema: Secondary | ICD-10-CM | POA: Diagnosis not present

## 2024-04-17 DIAGNOSIS — R2689 Other abnormalities of gait and mobility: Secondary | ICD-10-CM | POA: Diagnosis not present
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
## 2024-04-22 ENCOUNTER — Ambulatory Visit

## 2024-04-22 DIAGNOSIS — R600 Localized edema: Secondary | ICD-10-CM | POA: Diagnosis not present

## 2024-04-22 DIAGNOSIS — R2689 Other abnormalities of gait and mobility: Secondary | ICD-10-CM

## 2024-04-22 DIAGNOSIS — M6281 Muscle weakness (generalized): Secondary | ICD-10-CM

## 2024-04-22 DIAGNOSIS — M25561 Pain in right knee: Secondary | ICD-10-CM
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
## 2024-04-25 ENCOUNTER — Encounter: Payer: Self-pay | Admitting: Physical Therapy

## 2024-04-25 ENCOUNTER — Ambulatory Visit: Admitting: Physical Therapy

## 2024-04-25 DIAGNOSIS — R600 Localized edema: Secondary | ICD-10-CM

## 2024-04-25 DIAGNOSIS — M6281 Muscle weakness (generalized): Secondary | ICD-10-CM

## 2024-04-25 DIAGNOSIS — M25561 Pain in right knee: Secondary | ICD-10-CM

## 2024-04-25 DIAGNOSIS — R2689 Other abnormalities of gait and mobility: Secondary | ICD-10-CM | POA: Diagnosis not present
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
## 2024-04-28 ENCOUNTER — Other Ambulatory Visit (HOSPITAL_BASED_OUTPATIENT_CLINIC_OR_DEPARTMENT_OTHER): Payer: Self-pay

## 2024-04-28 ENCOUNTER — Ambulatory Visit (INDEPENDENT_AMBULATORY_CARE_PROVIDER_SITE_OTHER): Admitting: Family Medicine

## 2024-04-28 DIAGNOSIS — F32A Depression, unspecified: Secondary | ICD-10-CM

## 2024-04-28 DIAGNOSIS — F419 Anxiety disorder, unspecified: Secondary | ICD-10-CM | POA: Diagnosis not present

## 2024-04-28 MED ORDER — ESCITALOPRAM OXALATE 10 MG PO TABS
10.0000 mg | ORAL_TABLET | Freq: Every day | ORAL | 1 refills | Status: AC
  Filled 2024-04-28: qty 30, 30d supply, fill #0
  Filled 2024-04-29: qty 90, 90d supply, fill #0

## 2024-04-28 MED ORDER — HYDROXYZINE PAMOATE 50 MG PO CAPS
50.0000 mg | ORAL_CAPSULE | Freq: Three times a day (TID) | ORAL | 5 refills | Status: AC | PRN
Start: 2024-04-28 — End: ?
  Filled 2024-04-28 – 2024-04-29 (×2): qty 30, 10d supply, fill #0

## 2024-04-29 ENCOUNTER — Other Ambulatory Visit: Payer: Self-pay

## 2024-04-29 ENCOUNTER — Other Ambulatory Visit (HOSPITAL_BASED_OUTPATIENT_CLINIC_OR_DEPARTMENT_OTHER): Payer: Self-pay

## 2024-04-29 ENCOUNTER — Ambulatory Visit

## 2024-04-29 ENCOUNTER — Encounter: Payer: Self-pay | Admitting: Family Medicine

## 2024-04-29 DIAGNOSIS — M6281 Muscle weakness (generalized): Secondary | ICD-10-CM

## 2024-04-29 DIAGNOSIS — M25561 Pain in right knee: Secondary | ICD-10-CM | POA: Diagnosis not present

## 2024-04-29 DIAGNOSIS — R600 Localized edema: Secondary | ICD-10-CM | POA: Diagnosis not present

## 2024-04-29 DIAGNOSIS — R2689 Other abnormalities of gait and mobility: Secondary | ICD-10-CM
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
## 2024-05-01 ENCOUNTER — Ambulatory Visit

## 2024-05-01 DIAGNOSIS — M25561 Pain in right knee: Secondary | ICD-10-CM | POA: Diagnosis not present

## 2024-05-01 DIAGNOSIS — M6281 Muscle weakness (generalized): Secondary | ICD-10-CM

## 2024-05-01 DIAGNOSIS — R2689 Other abnormalities of gait and mobility: Secondary | ICD-10-CM | POA: Diagnosis not present

## 2024-05-01 DIAGNOSIS — R600 Localized edema: Secondary | ICD-10-CM
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
## 2024-05-06 ENCOUNTER — Ambulatory Visit: Attending: Orthopaedic Surgery

## 2024-05-06 DIAGNOSIS — R2689 Other abnormalities of gait and mobility: Secondary | ICD-10-CM | POA: Diagnosis not present

## 2024-05-06 DIAGNOSIS — R600 Localized edema: Secondary | ICD-10-CM | POA: Diagnosis not present

## 2024-05-06 DIAGNOSIS — M6281 Muscle weakness (generalized): Secondary | ICD-10-CM | POA: Diagnosis not present

## 2024-05-06 DIAGNOSIS — M25561 Pain in right knee: Secondary | ICD-10-CM | POA: Insufficient documentation
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
## 2024-05-08 ENCOUNTER — Ambulatory Visit

## 2024-05-08 DIAGNOSIS — R600 Localized edema: Secondary | ICD-10-CM | POA: Diagnosis not present

## 2024-05-08 DIAGNOSIS — M6281 Muscle weakness (generalized): Secondary | ICD-10-CM | POA: Diagnosis not present

## 2024-05-08 DIAGNOSIS — M25561 Pain in right knee: Secondary | ICD-10-CM

## 2024-05-08 DIAGNOSIS — R2689 Other abnormalities of gait and mobility: Secondary | ICD-10-CM
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
## 2024-05-13 ENCOUNTER — Ambulatory Visit

## 2024-05-13 DIAGNOSIS — R2689 Other abnormalities of gait and mobility: Secondary | ICD-10-CM

## 2024-05-13 DIAGNOSIS — M25561 Pain in right knee: Secondary | ICD-10-CM

## 2024-05-13 DIAGNOSIS — R600 Localized edema: Secondary | ICD-10-CM

## 2024-05-13 DIAGNOSIS — M6281 Muscle weakness (generalized): Secondary | ICD-10-CM
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
## 2024-05-15 ENCOUNTER — Encounter (HOSPITAL_BASED_OUTPATIENT_CLINIC_OR_DEPARTMENT_OTHER): Admitting: Orthopaedic Surgery

## 2024-05-15 ENCOUNTER — Encounter (HOSPITAL_BASED_OUTPATIENT_CLINIC_OR_DEPARTMENT_OTHER): Payer: Self-pay

## 2024-05-16 ENCOUNTER — Ambulatory Visit: Admitting: Physical Therapy

## 2024-05-16 ENCOUNTER — Telehealth: Payer: Self-pay | Admitting: Physical Therapy

## 2024-05-16 NOTE — Telephone Encounter (Signed)
Called and informed patient of missed visit and provided reminder of next appt and attendance policy via VM. 

## 2024-05-19 ENCOUNTER — Telehealth: Payer: Self-pay | Admitting: Orthopaedic Surgery

## 2024-05-19 NOTE — Telephone Encounter (Signed)
 Patient aunt called and said he needs an extension on his medical leave. CB#[phone]

## 2024-05-20 ENCOUNTER — Ambulatory Visit

## 2024-05-22 ENCOUNTER — Ambulatory Visit: Admitting: Physical Therapy

## 2024-05-23 ENCOUNTER — Telehealth (HOSPITAL_BASED_OUTPATIENT_CLINIC_OR_DEPARTMENT_OTHER): Payer: Self-pay | Admitting: Student

## 2024-05-23 NOTE — Telephone Encounter (Signed)
 Authorization in media. Records faxed.

## 2024-05-23 NOTE — Telephone Encounter (Signed)
 Dr Genelle post op

## 2024-05-27 ENCOUNTER — Ambulatory Visit
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
## 2024-05-29 ENCOUNTER — Ambulatory Visit

## 2024-06-03 ENCOUNTER — Encounter

## 2024-06-05 ENCOUNTER — Encounter

## 2024-06-10 ENCOUNTER — Encounter: Admitting: Physical Therapy

## 2024-06-12 ENCOUNTER — Encounter: Admitting: Physical Therapy

## 2024-09-08 ENCOUNTER — Encounter: Payer: Self-pay | Admitting: Radiology

## 2024-10-20 ENCOUNTER — Ambulatory Visit: Admitting: Family
